# Patient Record
Sex: Female | Born: 1975 | Race: White | Hispanic: No | Marital: Married | Smoking: Never smoker
Health system: Southern US, Community
[De-identification: ages and names within clinical notes are randomized; demographics above are authoritative.]

## PROBLEM LIST (undated history)

## (undated) DIAGNOSIS — E039 Hypothyroidism, unspecified: Secondary | ICD-10-CM

## (undated) HISTORY — PX: TUBAL LIGATION: SHX77

## (undated) HISTORY — PX: CARDIAC SURGERY: SHX584

---

## 1997-04-21 ENCOUNTER — Ambulatory Visit (HOSPITAL_COMMUNITY): Admission: RE | Admit: 1997-04-21 | Discharge: 1997-04-21 | Payer: Self-pay | Admitting: Obstetrics and Gynecology

## 1997-07-15 ENCOUNTER — Inpatient Hospital Stay (HOSPITAL_COMMUNITY): Admission: AD | Admit: 1997-07-15 | Discharge: 1997-07-15 | Payer: Self-pay | Admitting: Obstetrics and Gynecology

## 1997-07-23 ENCOUNTER — Inpatient Hospital Stay (HOSPITAL_COMMUNITY): Admission: AD | Admit: 1997-07-23 | Discharge: 1997-07-26 | Payer: Self-pay | Admitting: Obstetrics and Gynecology

## 1997-09-04 ENCOUNTER — Other Ambulatory Visit: Admission: RE | Admit: 1997-09-04 | Discharge: 1997-09-04 | Payer: Self-pay | Admitting: Obstetrics and Gynecology

## 1998-09-07 ENCOUNTER — Other Ambulatory Visit: Admission: RE | Admit: 1998-09-07 | Discharge: 1998-09-07 | Payer: Self-pay | Admitting: Obstetrics and Gynecology

## 2000-04-07 ENCOUNTER — Other Ambulatory Visit: Admission: RE | Admit: 2000-04-07 | Discharge: 2000-04-07 | Payer: Self-pay | Admitting: Obstetrics and Gynecology

## 2001-07-23 ENCOUNTER — Ambulatory Visit (HOSPITAL_COMMUNITY): Admission: RE | Admit: 2001-07-23 | Discharge: 2001-07-23 | Payer: Self-pay | Admitting: Obstetrics and Gynecology

## 2001-10-19 ENCOUNTER — Inpatient Hospital Stay (HOSPITAL_COMMUNITY): Admission: AD | Admit: 2001-10-19 | Discharge: 2001-10-22 | Payer: Self-pay | Admitting: Obstetrics and Gynecology

## 2001-12-05 ENCOUNTER — Other Ambulatory Visit: Admission: RE | Admit: 2001-12-05 | Discharge: 2001-12-05 | Payer: Self-pay | Admitting: Obstetrics and Gynecology

## 2002-12-31 ENCOUNTER — Other Ambulatory Visit: Admission: RE | Admit: 2002-12-31 | Discharge: 2002-12-31 | Payer: Self-pay | Admitting: Obstetrics and Gynecology

## 2004-01-29 ENCOUNTER — Other Ambulatory Visit: Admission: RE | Admit: 2004-01-29 | Discharge: 2004-01-29 | Payer: Self-pay | Admitting: Obstetrics and Gynecology

## 2005-02-24 ENCOUNTER — Other Ambulatory Visit: Admission: RE | Admit: 2005-02-24 | Discharge: 2005-02-24 | Payer: Self-pay | Admitting: Obstetrics and Gynecology

## 2007-09-21 ENCOUNTER — Inpatient Hospital Stay (HOSPITAL_COMMUNITY): Admission: RE | Admit: 2007-09-21 | Discharge: 2007-09-23 | Payer: Self-pay | Admitting: Obstetrics and Gynecology

## 2010-06-08 NOTE — H&P (Signed)
NAMEBEAUTY, PLESS                    ACCOUNT NO.:  [id number]   MEDICAL RECORD NO.:  [id number]          PATIENT TYPE:  INP   LOCATION:  NA                            FACILITY:  [facility]   PHYSICIAN:  Duke Salvia. Marcelle Overlie, M.D.DATE OF BIRTH:  1975/12/08   DATE OF ADMISSION:  09/20/2007  DATE OF DISCHARGE:                              HISTORY & PHYSICAL   CHIEF COMPLAINT:  Repeat cesarean section, term.   HISTORY OF PRESENT ILLNESS:  A 35 year old G3 P2, EDD 09/29/2007,  presents for RCS after an uncomplicated prenatal course.  First  trimester screen and AFP screening were normal, GBS was negative.  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  Two previous cesareans,  scheduled for repeat.  This procedure including risks related to  bleeding, infection, adjacent organ injury, transfusion, her expected  recovery time, all related to her which she understands and accepts.   PAST MEDICAL HISTORY:   ALLERGIES:  None.   OPERATIONS:  Cesarean section x2.  Please see the [facility] form for the  details of her past medical history.   PHYSICAL EXAMINATION:  VITAL SIGNS:  Temperature 98.2, blood pressure  120/78.  HEENT: Unremarkable.  NECK:  Supple without masses.  LUNGS:  Clear.  CARDIOVASCULAR:  Regular rate and rhythm without murmurs, rubs, gallops  noted.  BREASTS:  Not examined.  Term fundal height.  Fetal heart rate was 140.  Cervix was closed.  EXTREMITIES/NEUROLOGIC:  Exam unremarkable.   IMPRESSION:  Term IUP.   PLAN:  Repeat cesarean section.  Procedure and risks reviewed as above.      Richard M. Marcelle Overlie, M.D.  Electronically Signed     RMH/MEDQ  D:  09/20/2007  T:  09/20/2007  Job:  [id number]

## 2010-06-08 NOTE — Op Note (Signed)
Christy Blake, Christy Blake                    ACCOUNT NO.:  [id number]   MEDICAL RECORD NO.:  [id number]          PATIENT TYPE:  INP   LOCATION:  9112                          FACILITY:  [facility]   PHYSICIAN:  Duke Salvia. Marcelle Overlie, M.D.DATE OF BIRTH:  [birth year]   DATE OF PROCEDURE:  DATE OF DISCHARGE:                               OPERATIVE REPORT   PREOPERATIVE DIAGNOSES:  1. Term intrauterine pregnancy for repeat cesarean section.  2. Request permanent sterilization.   POSTOPERATIVE DIAGNOSES:  1. Term intrauterine pregnancy for repeat cesarean section.  2. Request permanent sterilization.   PROCEDURE:  Repeat low-transverse cesarean section and Filshie clip  tubal ligation.   SURGEON:  Duke Salvia. Marcelle Overlie, MD.   ANESTHESIA:  Spinal.   COMPLICATIONS:  None.   DRAINS:  Foley catheter.   BLOOD LOSS:  700 mL.   PROCEDURE AND FINDINGS:  The patient was taken to the operating room  after adequate level of spinal anesthetic was obtained with the patient  in the left tilt position.  The abdomen was prepped and draped in the  usual manner for sterile abdominal procedure.  Foley catheter positioned  draining clear urine.  A transverse incision was made through the old  scar, which was well healed and carried down the fascia, which was  incised, and extended transversely.  Rectus muscles were divided in the  midline.  Peritoneum entered superiorly without incident and extended  vertically.  The vesicouterine serosa was then incised and the bladder  was bluntly and sharply dissected below, bladder blade was positioned at  that point.  A transverse incision was made at the lower segment,  extended with blunt dissection, clear fluid noted.   The patient then delivered of a healthy female Apgars 9 and 9, 8 pounds  and 5 ounces.  The infant was suctioned, cord clamped and passed to the  pediatric team for further care.  The placenta was delivered manually  and tagged, uterus exteriorized, cavity wiped  clean with laparotomy  pack.  Closure obtained with first layer of 0 chromic in a locked  fashion followed by an imbricating layer of 0 chromic.  This was  hemostatic.  The bladder flap area was inspected carefully and noted to  be intact and hemostatic.  Tubes and ovaries were normal.  Filshie clip  was then applied at the right angle, 3-cm from the cornua on each side  at the right, completely engulfing the tube with excellent application.  This was repeated on the opposite side.  The uterus was then returned to  abdominal position.  Prior to closure, sponge, needle, and instrument  counts were reported correct x2.  The perineum was closed with running 2-  0 Vicryl suture.  Rectus muscles were reapproximated in the midline with  2-0 Vicryl interrupted sutures.  Fascia was closed  from laterally to midline on either side with a 0 PDS suture.  Subcutaneous tissue was undermined to effect better closure, made  hemostatic with the Bovie.  Clips and Steri-Strips were used on the skin  along with a pressure dressing.  She  tolerated this well, went to  recovery room in good condition.  Clear urine noted during the case.      Richard M. Marcelle Overlie, M.D.  Electronically Signed     RMH/MEDQ  D:  09/21/2007  T:  09/21/2007  Job:  [id number]

## 2010-06-11 NOTE — Discharge Summary (Signed)
Christy Blake, COTTERMAN                    ACCOUNT NO.:  [id number]   MEDICAL RECORD NO.:  [id number]          PATIENT TYPE:  INP   LOCATION:  9112                          FACILITY:  [facility]   PHYSICIAN:  Freddy Finner, M.D.   DATE OF BIRTH:  04/15/1975   DATE OF ADMISSION:  09/21/2007  DATE OF DISCHARGE:  09/23/2007                               DISCHARGE SUMMARY   ADMITTING DIAGNOSES:  1. Intrauterine pregnancy at term.  2. Previous cesarean section, desires repeat.  3. Multiparity desires permanent sterilization.   DISCHARGE DIAGNOSES:  1. Status post low transverse cesarean section.  2. Viable female infant.  3. Permanent sterilization.   PROCEDURE:  Repeat low transverse cesarean section.   REASON FOR ADMISSION:  Please see dictated H&P.   HOSPITAL COURSE:  The patient was a 35 year old gravida 3, para 2 that  presents to [facility] for scheduled cesarean delivery.  The patient had a previous cesarean delivery and desired repeat.  Due to  multiparity, the patient had also requested permanent sterilization.  On  the morning of admission, the patient was taken to the operating room  where spinal anesthesia was administered without difficulty.  A low  transverse incision was made with delivery of a viable female infant  weighing 8 pounds 5 ounces.  Apgars of 9 at 1 minutes, 9 at 5 minutes.  Bilateral tubal ligation was performed without difficulty.  The patient  tolerated procedure well and was taken to the recovery room in stable  condition.  On postoperative day #1, the patient was without complaint.  Vital signs were stable.  She was afebrile.  Fundus firm and nontender.  Abdominal dressing noted to be clean, dry, and intact.   Laboratory findings showed hemoglobin of 11.6.  On postoperative day #2,  the patient did desire early discharge.  Vital signs remained stable.  She was afebrile.  Fundus firm and nontender.  Incision was clean, dry,  and intact.  Discharge  instructions were reviewed and the patient was  later discharged to home.   CONDITION ON DISCHARGE:  Stable.   DIET:  Regular as tolerated.   ACTIVITY:  No heavy lifting.  No driving x2 weeks.  No vaginal entry.   FOLLOW UP:  The patient to follow up in the office in 2-3 days for  staple removal.  She is to call for temperature greater than 100  degrees, persistent nausea, vomiting, and heavy vaginal bleeding and/or  redness or drainage from the incisional site.   DISCHARGE MEDICATIONS:  1. Tylox #30 one p.o. every 4-6 hours p.r.n.  2. Motrin 600 mg every 6 hours p.r.n.  3. Prenatal vitamins 1 p.o. daily.  4. Colace 1 p.o. daily p.r.n.      Julio Sicks, N.P.      Freddy Finner, M.D.  Electronically Signed    CC/MEDQ  D:  10/17/2007  T:  10/17/2007  Job:  [id number]

## 2010-06-11 NOTE — Discharge Summary (Signed)
NAME:  Christy Blake, Christy Blake                              ACCOUNT NO.:  [id number]   MEDICAL RECORD NO.:  [id number]                   PATIENT TYPE:  INP   LOCATION:  9121                                 FACILITY:  [facility]   PHYSICIAN:  Juluis Mire, M.D.                DATE OF BIRTH:  May 02, 1975   DATE OF ADMISSION:  10/19/2001  DATE OF DISCHARGE:  10/22/2001                                 DISCHARGE SUMMARY   ADMITTING DIAGNOSES:  1. Intrauterine pregnancy at term.  2. Previous cesarean delivery, desires of repeat.   DISCHARGE DIAGNOSES:  1. Status post cesarean delivery.  2. Viable female infant.   PROCEDURE:  Repeat low transverse cesarean section.   REASON FOR ADMISSION:  Please see dictated H&P.   HOSPITAL COURSE:  The patient presented to the hospital for a scheduled  cesarean section.  The patient had had a previous cesarean section for  failure to progress.  She declined a vaginal birth after cesarean and  presented for repeat cesarean section at term.  The patient was taken to the  operating room where spinal anesthesia was administered without difficulty.  A low transverse incision was made with delivery of a viable female infant  weighing 8 pounds 5 ounces with Apgars of 8 at one minute, 9 at five  minutes.  The patient tolerated procedure well and was taken to the recovery  room in stable condition.  On postoperative day one abdomen was soft.  The  patient had good return of bowel function.  Abdominal dressing was clean,  dry, and intact.  Laboratories revealed a hemoglobin of 12.3, platelet count  of 147,000, WBC count of 11.8.  On postoperative day two patient was  tolerating a regular diet without complaint of nausea and vomiting.  Abdomen  was soft.  Abdominal dressing was removed and incision was clean, dry, and  intact.  The fundus was firm and nontender.  On postoperative day three  patient was without complaint.  Abdomen was soft.  The patient was  ambulating well.   Incision was clean, dry, and intact.  Staples were  removed.  RhoGAM was administered and patient was discharged home.   CONDITION ON DISCHARGE:  Good.   DIET:  Regular, as tolerated.   ACTIVITY:  No heavy lifting.  No driving x2 weeks.  No vaginal entry.   FOLLOW UP:  The patient is to follow up in the office in one to two weeks  for an incision check.  She is to call for temperature greater than 100  degrees, persistent nausea or vomiting, heavy vaginal bleeding, and/or  redness or drainage from the incision site.   DISCHARGE MEDICATIONS:  1. Percocet 5/325 number 30 one p.o. q.4-6h. p.r.n. pain.  2.     Ibuprofen 600 mg q.6h. p.r.n.  3. Prenatal vitamins one p.o. daily.  4. Colace one p.o. daily p.r.n.  Julio Sicks, NP                          Juluis Mire, M.D.    CC/MEDQ  D:  11/18/2001  T:  11/19/2001  Job:  [id number]

## 2010-06-11 NOTE — H&P (Signed)
   NAME:  Christy Blake, Christy Blake                              ACCOUNT NO.:  [id number]   MEDICAL RECORD NO.:  [id number]                   PATIENT TYPE:  INP   LOCATION:  NA                                   [facility]   PHYSICIAN:  Duke Salvia. Marcelle Overlie, M.D.            DATE OF BIRTH:   DATE OF ADMISSION:  DATE OF DISCHARGE:                                HISTORY & PHYSICAL   CHIEF COMPLAINT:  Repeat cesarean section at term.   HISTORY OF PRESENT ILLNESS:  The patient is a 35 year old G2, P-1-0-0-1 with  previous cesarean section for failure to progress.  She declines VBAC and  presents for repeat cesarean section at term.  The risks of the procedure  including bleeding, infection, transfusion and adjacent organ injury were  all reviewed with her which she understands and accepts.  This pregnancy has  been uncomplicated.  She has a blood type of A-.  She did receive RhoGAM.  Rubella titer was positive.  One-hour GGT was 87.  Group B Strep was screen  was negative.   PAST MEDICAL HISTORY:  Negative.   ALLERGIES:  None.   OPERATION:  Cesarean section in 1999.   REVIEW OF SYSTEMS:  Otherwise unremarkable except for corrective open heart  surgery in the patient at age 57 years old.  Her cardiac status as an adult  has been functionally normal.   PHYSICAL EXAMINATION:  GENERAL:  Temperature 92, blood pressure 120/68.  HEENT:  Unremarkable  NECK:  Supple without masses.  LUNGS:  Clear.  CARDIOVASCULAR:  Regular rate and rhythm without murmurs, rubs or gallops.  BREASTS:  Not examined.  ABDOMEN:  Term fundal height.  Fetal heart rate 140.  PELVIC:  Cervix closed.  NEUROLOGICAL:  Unremarkable.   IMPRESSION:  Term intrauterine pregnancy, previous cesarean section.  Declines vaginal birth after cesarean section.   PLAN:  Repeat cesarean section.  Procedure and risks reviewed as above.                                                Richard M. Marcelle Overlie, M.D.    RMH/MEDQ  D:  10/18/2001   T:  10/18/2001  Job:  [id number]

## 2010-06-11 NOTE — Op Note (Signed)
NAME:  Christy Blake, Christy Blake                              ACCOUNT NO.:  [id number]   MEDICAL RECORD NO.:  [id number]                   PATIENT TYPE:  INP   LOCATION:  9198                                 [facility]ILITY[facility]   PHYSICIAN:  Duke Salvia. Marcelle Overlie, M.D.            DATE OF BIRTH:  Mar 01, 1975   DATE OF PROCEDURE:  10/19/2001  DATE OF DISCHARGE:                                 OPERATIVE REPORT   PREOPERATIVE DIAGNOSES:  Repeat cesarean section at term.   POSTOPERATIVE DIAGNOSES:  Repeat cesarean section at term.   PROCEDURE:  Repeat low transverse cesarean section.   SURGEON:  Duke Salvia. Marcelle Overlie, M.D.   ASSISTANT:  Guy Sandifer. Henderson Cloud, M.D.   ANESTHESIA:  Spinal.   COMPLICATIONS:  None.   DRAINS:  Foley catheter.   ESTIMATED BLOOD LOSS:  800.   PROCEDURE AND FINDINGS:  The patient went to the operating room.  After an  adequate level of spinal anesthetic was obtained with the patient in supine  position, the abdomen prepped and draped in usual manner for sterile  abdominal procedures, Foley tip catheter positioned draining clear urine.  Transverse incision made through the old scar which was well healed, carried  down to the fascia which was incised and extended transversely.  Rectus  muscle divided in the midline.  Peritoneum entered superiorly without  incident and extended in a vertical manner.  The vesicouterine serosa was  then incised and the bladder was bluntly and sharply dissected off of the  lower uterine segment and a bladder blade was positioned.  Transverse  incision made in the lower uterine segment.  Clear fluid was noted.  The  patient then delivered of a healthy female.  Apgars 9 and 9.  Loose nuchal  cord x1.  The infant was suctioned, cord clamped and passed pediatric team  for further care.  Placenta delivered manually intact.  The uterus  exteriorized.  Cavity wiped clean with a laparotomy pack.  Closure obtained  of the first layer of 0 chromic in a locked fashion  followed by an  embrocating layer of 0 chromic.  This was hemostatic.  Tubes and ovaries  were normal.  Prior to closure sponge, needle, and instrument counts  reported as correct x2.  Rectus muscles reapproximated with 2-0 Dexon  interrupted sutures.  Fascia closed from laterally to midline on either side  with a 0 PDS suture.  Subcutaneous fat was hemostatic.  Clips and Steri-  Strips were used on the skin.  She tolerated this well.  Went to recovery  room in good condition.  Clear urine noted at the end of the case.  She  received Pitocin IV and Ancef 1 g IV after the cord was clamped.  Richard M. Marcelle Overlie, M.D.    RMH/MEDQ  D:  10/19/2001  T:  10/19/2001  Job:  [id number] [id number]

## 2013-09-09 ENCOUNTER — Emergency Department (HOSPITAL_BASED_OUTPATIENT_CLINIC_OR_DEPARTMENT_OTHER)
Admission: EM | Admit: 2013-09-09 | Discharge: 2013-09-09 | Disposition: A | Discharge status: Home / Self Care | Payer: BC Managed Care – PPO | Attending: Emergency Medicine | Admitting: Emergency Medicine

## 2013-09-09 ENCOUNTER — Encounter (HOSPITAL_BASED_OUTPATIENT_CLINIC_OR_DEPARTMENT_OTHER): Payer: Self-pay | Admitting: Emergency Medicine

## 2013-09-09 DIAGNOSIS — M545 Low back pain, unspecified: Secondary | ICD-10-CM | POA: Diagnosis present

## 2013-09-09 DIAGNOSIS — Z9889 Other specified postprocedural states: Secondary | ICD-10-CM | POA: Insufficient documentation

## 2013-09-09 DIAGNOSIS — Z791 Long term (current) use of non-steroidal anti-inflammatories (NSAID): Secondary | ICD-10-CM | POA: Insufficient documentation

## 2013-09-09 DIAGNOSIS — R000 Tachycardia, unspecified: Secondary | ICD-10-CM | POA: Insufficient documentation

## 2013-09-09 DIAGNOSIS — Z79899 Other long term (current) drug therapy: Secondary | ICD-10-CM | POA: Diagnosis not present

## 2013-09-09 MED ORDER — ONDANSETRON HCL 4 MG/2ML IJ SOLN
4.0000 mg | Freq: Once | INTRAMUSCULAR | Status: AC
  Administered 2013-09-09: 4 mg via INTRAMUSCULAR
  Filled 2013-09-09: qty 2

## 2013-09-09 MED ORDER — HYDROMORPHONE HCL PF 1 MG/ML IJ SOLN
2.0000 mg | Freq: Once | INTRAMUSCULAR | Status: AC
  Administered 2013-09-09: 2 mg via INTRAMUSCULAR
  Filled 2013-09-09: qty 2

## 2013-09-09 MED ORDER — KETOROLAC TROMETHAMINE 60 MG/2ML IM SOLN
60.0000 mg | Freq: Once | INTRAMUSCULAR | Status: AC
  Administered 2013-09-09: 60 mg via INTRAMUSCULAR
  Filled 2013-09-09: qty 2

## 2013-09-09 MED ORDER — HYDROCODONE-ACETAMINOPHEN 5-325 MG PO TABS: 1.0000 | ORAL_TABLET | ORAL | Status: DC | PRN

## 2013-09-09 NOTE — ED Notes (Signed)
Shari Upstill, PA-C at bedside 

## 2013-09-09 NOTE — ED Notes (Signed)
Pt c/o right lower back pain which radiates down right leg x 3 days

## 2013-09-09 NOTE — ED Provider Notes (Signed)
CSN: [id number]     Arrival date & time 09/09/13  1525 History   First MD Initiated Contact with Patient 09/09/13 1533     Chief Complaint  Patient presents with  . Back Pain     (Consider location/radiation/quality/duration/timing/severity/associated sxs/prior Treatment) Patient is a 38 y.o. female presenting with back pain. The history is provided by the patient. No language interpreter was used.  Back Pain Location:  Lumbar spine Quality:  Aching Pain severity:  Severe Pain is:  Worse during the day Onset quality:  Gradual Duration:  2 months Timing:  Constant Progression:  Worsening Chronicity:  New Context: not recent illness   Relieved by:  Nothing Worsened by:  Nothing tried Ineffective treatments:  None tried Associated symptoms: leg pain   Pt has had pain in her back for the past 2 months.  Pt reports pain goes down her leg.  History reviewed. No pertinent past medical history. Past Surgical History  Procedure Laterality Date  . Cardiac surgery    . Cesarean section    . Tubal ligation     History reviewed. No pertinent family history. History  Substance Use Topics  . Smoking status: Never Smoker   . Smokeless tobacco: Not on file  . Alcohol Use: 1.5 oz/week    3 drink(s) per week   OB History   Grav Para Term Preterm Abortions TAB SAB Ect Mult Living                 Review of Systems  Musculoskeletal: Positive for back pain.  All other systems reviewed and are negative.     Allergies  Review of patient's allergies indicates no known allergies.  Home Medications   Prior to Admission medications   Medication Sig Start Date End Date Taking? Authorizing Provider  iron polysaccharides (NIFEREX) 150 MG capsule Take 90 mg by mouth daily.   Yes Historical Provider, MD  levothyroxine (SYNTHROID, LEVOTHROID) 25 MCG tablet Take 25 mcg by mouth daily before breakfast.   Yes Historical Provider, MD  meloxicam (MOBIC) 15 MG tablet Take 15 mg by mouth daily.    Yes Historical Provider, MD   BP 149/100  Pulse 136  Temp(Src) 98 F (36.7 C) (Oral)  Resp 16  Ht 5\' 6"  (1.676 m)  Wt 140 lb (63.504 kg)  BMI 22.61 kg/m2  SpO2 100% Physical Exam  Nursing note and vitals reviewed. Constitutional: She is oriented to person, place, and time. She appears well-developed and well-nourished.  HENT:  Head: Normocephalic.  Eyes: Pupils are equal, round, and reactive to light.  Neck: Neck supple.  Cardiovascular:  tachycardic  Pulmonary/Chest: Effort normal.  Abdominal: Soft.  Musculoskeletal: She exhibits tenderness.  Tender right sciatic area  Neurological: She is alert and oriented to person, place, and time.  Skin: Skin is warm.  Psychiatric: She has a normal mood and affect.    ED Course  Procedures (including critical care time) Labs Review Labs Reviewed - No data to display  Imaging Review No results found.   EKG Interpretation None      MDM   Final diagnoses:  None        Elson AreasLeslie K Isrrael Fluckiger, PA-C 09/09/13 1610

## 2013-09-09 NOTE — Discharge Instructions (Signed)
Back Pain, Adult °Low back pain is very common. About 1 in 5 people have back pain. The cause of low back pain is rarely dangerous. The pain often gets better over time. About half of people with a sudden onset of back pain feel better in just 2 weeks. About 8 in 10 people feel better by 6 weeks.  °CAUSES °Some common causes of back pain include: °· Strain of the muscles or ligaments supporting the spine. °· Wear and tear (degeneration) of the spinal discs. °· Arthritis. °· Direct injury to the back. °DIAGNOSIS °Most of the time, the direct cause of low back pain is not known. However, back pain can be treated effectively even when the exact cause of the pain is unknown. Answering your caregiver's questions about your overall health and symptoms is one of the most accurate ways to make sure the cause of your pain is not dangerous. If your caregiver needs more information, he or she may order lab work or imaging tests (X-rays or MRIs). However, even if imaging tests show changes in your back, this usually does not require surgery. °HOME CARE INSTRUCTIONS °For many people, back pain returns. Since low back pain is rarely dangerous, it is often a condition that people can learn to manage on their own.  °· Remain active. It is stressful on the back to sit or stand in one place. Do not sit, drive, or stand in one place for more than 30 minutes at a time. Take short walks on level surfaces as soon as pain allows. Try to increase the length of time you walk each day. °· Do not stay in bed. Resting more than 1 or 2 days can delay your recovery. °· Do not avoid exercise or work. Your body is made to move. It is not dangerous to be active, even though your back may hurt. Your back will likely heal faster if you return to being active before your pain is gone. °· Pay attention to your body when you  bend and lift. Many people have less discomfort when lifting if they bend their knees, keep the load close to their bodies, and  avoid twisting. Often, the most comfortable positions are those that put less stress on your recovering back. °· Find a comfortable position to sleep. Use a firm mattress and lie on your side with your knees slightly bent. If you lie on your back, put a pillow under your knees. °· Only take over-the-counter or prescription medicines as directed by your caregiver. Over-the-counter medicines to reduce pain and inflammation are often the most helpful. Your caregiver may prescribe muscle relaxant drugs. These medicines help dull your pain so you can more quickly return to your normal activities and healthy exercise. °· Put ice on the injured area. °¨ Put ice in a plastic bag. °¨ Place a towel between your skin and the bag. °¨ Leave the ice on for 15-20 minutes, 03-04 times a day for the first 2 to 3 days. After that, ice and heat may be alternated to reduce pain and spasms. °· Ask your caregiver about trying back exercises and gentle massage. This may be of some benefit. °· Avoid feeling anxious or stressed. Stress increases muscle tension and can worsen back pain. It is important to recognize when you are anxious or stressed and learn ways to manage it. Exercise is a great option. °SEEK MEDICAL CARE IF: °· You have pain that is not relieved with rest or medicine. °· You have pain that does not improve in 1 week. °· You have new symptoms. °· You are generally not feeling well. °SEEK   IMMEDIATE MEDICAL CARE IF:  °· You have pain that radiates from your back into your legs. °· You develop new bowel or bladder control problems. °· You have unusual weakness or numbness in your arms or legs. °· You develop nausea or vomiting. °· You develop abdominal pain. °· You feel faint. °Document Released: 01/10/2005 Document Revised: 07/12/2011 Document Reviewed: 05/14/2013 °[employer]® Patient Information ©2015 [employer]. This information is not intended to replace advice given to you by your health care provider. Make sure you  discuss any questions you have with your health care provider. °Cryotherapy °Cryotherapy means treatment with cold. Ice or gel packs can be used to reduce both pain and swelling. Ice is the most helpful within the first 24 to 48 hours after an injury or flare-up from overusing a muscle or joint. Sprains, strains, spasms, burning pain, shooting pain, and aches can all be eased with ice. Ice can also be used when recovering from surgery. Ice is effective, has very few side effects, and is safe for most people to use. °PRECAUTIONS  °Ice is not a safe treatment option for people with: °· Raynaud phenomenon. This is a condition affecting small blood vessels in the extremities. Exposure to cold may cause your problems to return. °· Cold hypersensitivity. There are many forms of cold hypersensitivity, including: °¨ Cold urticaria. Red, itchy hives appear on the skin when the tissues begin to warm after being iced. °¨ Cold erythema. This is a red, itchy rash caused by exposure to cold. °¨ Cold hemoglobinuria. Red blood cells break down when the tissues begin to warm after being iced. The hemoglobin that carry oxygen are passed into the urine because they cannot combine with blood proteins fast enough. °· Numbness or altered sensitivity in the area being iced. °If you have any of the following conditions, do not use ice until you have discussed cryotherapy with your caregiver: °· Heart conditions, such as arrhythmia, angina, or chronic heart disease. °· High blood pressure. °· Healing wounds or open skin in the area being iced. °· Current infections. °· Rheumatoid arthritis. °· Poor circulation. °· Diabetes. °Ice slows the blood flow in the region it is applied. This is beneficial when trying to stop inflamed tissues from spreading irritating chemicals to surrounding tissues. However, if you expose your skin to cold temperatures for too long or without the proper protection, you can damage your skin or nerves. Watch for  signs of skin damage due to cold. °HOME CARE INSTRUCTIONS °Follow these tips to use ice and cold packs safely. °· Place a dry or damp towel between the ice and skin. A damp towel will cool the skin more quickly, so you may need to shorten the time that the ice is used. °· For a more rapid response, add gentle compression to the ice. °· Ice for no more than 10 to 20 minutes at a time. The bonier the area you are icing, the less time it will take to get the benefits of ice. °· Check your skin after 5 minutes to make sure there are no signs of a poor response to cold or skin damage. °· Rest 20 minutes or more between uses. °· Once your skin is numb, you can end your treatment. You can test numbness by very lightly touching your skin. The touch should be so light that you do not see the skin dimple from the pressure of your fingertip. When using ice, most people will feel these normal sensations in this order: cold,   burning, aching, and numbness. °· Do not use ice on someone who cannot communicate their responses to pain, such as small children or people with dementia. °HOW TO MAKE AN ICE PACK °Ice packs are the most common way to use ice therapy. Other methods include ice massage, ice baths, and cryosprays. Muscle creams that cause a cold, tingly feeling do not offer the same benefits that ice offers and should not be used as a substitute unless recommended by your caregiver. °To make an ice pack, do one of the following: °· Place crushed ice or a bag of frozen vegetables in a sealable plastic bag. Squeeze out the excess air. Place this bag inside another plastic bag. Slide the bag into a pillowcase or place a damp towel between your skin and the bag. °· Mix 3 parts water with 1 part rubbing alcohol. Freeze the mixture in a sealable plastic bag. When you remove the mixture from the freezer, it will be slushy. Squeeze out the excess air. Place this bag inside another plastic bag. Slide the bag into a pillowcase or place  a damp towel between your skin and the bag. °SEEK MEDICAL CARE IF: °· You develop white spots on your skin. This may give the skin a blotchy (mottled) appearance. °· Your skin turns blue or pale. °· Your skin becomes waxy or hard. °· Your swelling gets worse. °MAKE SURE YOU:  °· Understand these instructions. °· Will watch your condition. °· Will get help right away if you are not doing well or get worse. °Document Released: 09/06/2010 Document Revised: 05/27/2013 Document Reviewed: 09/06/2010 °Exit[employer]® Patient Information ©2015 [employer]. This information is not intended to replace advice given to you by your health care provider. Make sure you discuss any questions you have with your health care provider. ° °

## 2013-09-09 NOTE — ED Notes (Signed)
Soda provided

## 2013-09-09 NOTE — ED Provider Notes (Signed)
Recheck after medications finds the patient in less pain. Ambulatory without difficulty. Reports pain controlled. No further concerns. All questions answered for patient and husband.  Arnoldo HookerShari A Donatello Kleve, PA-C 09/09/13 1720

## 2013-09-09 NOTE — ED Provider Notes (Signed)
Medical screening examination/treatment/procedure(s) were performed by non-physician practitioner and as supervising physician I was immediately available for consultation/collaboration.   EKG Interpretation None       Jonte Wollam, MD 09/09/13 2327 

## 2013-09-09 NOTE — ED Provider Notes (Signed)
Medical screening examination/treatment/procedure(s) were performed by non-physician practitioner and as supervising physician I was immediately available for consultation/collaboration.   EKG Interpretation None       Doug SouSam Burris Matherne, MD 09/09/13 [phone]

## 2013-09-10 ENCOUNTER — Other Ambulatory Visit: Payer: Self-pay | Admitting: Physical Medicine and Rehabilitation

## 2013-09-10 DIAGNOSIS — M545 Low back pain, unspecified: Secondary | ICD-10-CM

## 2013-09-13 ENCOUNTER — Ambulatory Visit
Admission: RE | Admit: 2013-09-13 | Discharge: 2013-09-13 | Disposition: A | Discharge status: Home / Self Care | Payer: BC Managed Care – PPO | Source: Ambulatory Visit | Attending: Physical Medicine and Rehabilitation | Admitting: Physical Medicine and Rehabilitation

## 2013-09-13 DIAGNOSIS — M545 Low back pain, unspecified: Secondary | ICD-10-CM

## 2013-10-30 ENCOUNTER — Other Ambulatory Visit (HOSPITAL_COMMUNITY): Payer: Self-pay | Admitting: Specialist

## 2013-11-01 ENCOUNTER — Encounter (HOSPITAL_COMMUNITY): Payer: Self-pay | Admitting: Pharmacy Technician

## 2013-11-06 ENCOUNTER — Ambulatory Visit (HOSPITAL_COMMUNITY)
Admission: RE | Admit: 2013-11-06 | Discharge: 2013-11-06 | Disposition: A | Discharge status: Home / Self Care | Payer: BC Managed Care – PPO | Source: Ambulatory Visit | Attending: Specialist | Admitting: Specialist

## 2013-11-06 ENCOUNTER — Encounter (HOSPITAL_COMMUNITY): Payer: Self-pay

## 2013-11-06 DIAGNOSIS — M5126 Other intervertebral disc displacement, lumbar region: Secondary | ICD-10-CM | POA: Diagnosis not present

## 2013-11-06 HISTORY — DX: Hypothyroidism, unspecified: E03.9

## 2013-11-06 LAB — CBC
HEMATOCRIT: 39.2 % (ref 36.0–46.0)
HEMOGLOBIN: 13.6 g/dL (ref 12.0–15.0)
MCH: 31.9 pg (ref 26.0–34.0)
MCHC: 34.7 g/dL (ref 30.0–36.0)
MCV: 92 fL (ref 78.0–100.0)
Platelets: 166 10*3/uL (ref 150–400)
RBC: 4.26 MIL/uL (ref 3.87–5.11)
RDW: 12.4 % (ref 11.5–15.5)
WBC: 5.4 10*3/uL (ref 4.0–10.5)

## 2013-11-06 LAB — COMPREHENSIVE METABOLIC PANEL
ALBUMIN: 4.4 g/dL (ref 3.5–5.2)
ALT: 19 U/L (ref 0–35)
ANION GAP: 14 (ref 5–15)
AST: 19 U/L (ref 0–37)
Alkaline Phosphatase: 51 U/L (ref 39–117)
BILIRUBIN TOTAL: 0.4 mg/dL (ref 0.3–1.2)
BUN: 8 mg/dL (ref 6–23)
CHLORIDE: 101 meq/L (ref 96–112)
CO2: 24 mEq/L (ref 19–32)
CREATININE: 0.7 mg/dL (ref 0.50–1.10)
Calcium: 9.5 mg/dL (ref 8.4–10.5)
GLUCOSE: 146 mg/dL — AB (ref 70–99)
Potassium: 3.8 mEq/L (ref 3.7–5.3)
Sodium: 139 mEq/L (ref 137–147)
Total Protein: 7.8 g/dL (ref 6.0–8.3)

## 2013-11-06 LAB — SURGICAL PCR SCREEN
MRSA, PCR: NEGATIVE
Staphylococcus aureus: NEGATIVE

## 2013-11-06 LAB — HCG, SERUM, QUALITATIVE: Preg, Serum: NEGATIVE

## 2013-11-06 NOTE — Pre-Procedure Instructions (Addendum)
Christy Blake  11/06/2013   Your procedure is scheduled on:  11/08/13  [facility] at 1030 AM.  Call this number if you have problems the morning of surgery: [phone]   Remember:   Do not eat food or drink liquids after midnight.   Take these medicines the morning of surgery with A SIP OF WATER: levothyroxine    STOP all herbel meds, nsaids (aleve,naproxen,advil,ibuprofen) now including vitamins,aspirin   Do not wear jewelry, make-up or nail polish.  Do not wear lotions, powders, or perfumes. You may wear deodorant.  Do not shave 48 hours prior to surgery. Men may shave face and neck.  Do not bring valuables to the hospital.  [facility] not [facility]                  for any belongings or valuables.               Contacts, dentures or bridgework may not be worn into surgery.  Leave suitcase in the car. After surgery it may be brought to your room.  For patients admitted to the hospital, discharge time is determined by your                treatment team.               Patients discharged the day of surgery will not be allowed to drive  home.  Name and phone number of your driver:   Special Instructions:  Special Instructions: [place] - Preparing for Surgery  Before surgery, you can play an important role.  Because skin is not sterile, your skin needs to be as free of germs as possible.  You can reduce the number of germs on you skin by washing with CHG (chlorahexidine gluconate) soap before surgery.  CHG is an antiseptic cleaner which kills germs and bonds with the skin to continue killing germs even after washing.  Please DO NOT use if you have an allergy to CHG or antibacterial soaps.  If your skin becomes reddened/irritated stop using the CHG and inform your nurse when you arrive at Short Stay.  Do not shave (including legs and underarms) for at least 48 hours prior to the first CHG shower.  You may shave your face.  Please follow these  instructions carefully:   1.  Shower with CHG Soap the night before surgery and the morning of Surgery.  2.  If you choose to wash your hair, wash your hair first as usual with your normal shampoo.  3.  After you shampoo, rinse your hair and body thoroughly to remove the Shampoo.  4.  Use CHG as you would any other liquid soap.  You can apply chg directly  to the skin and wash gently with scrungie or a clean washcloth.  5.  Apply the CHG Soap to your body ONLY FROM THE NECK DOWN.  Do not use on open wounds or open sores.  Avoid contact with your eyes ears, mouth and genitals (private parts).  Wash genitals (private parts)       with your normal soap.  6.  Wash thoroughly, paying special attention to the area where your surgery will be performed.  7.  Thoroughly rinse your body with warm water from the neck down.  8.  DO NOT shower/wash with your normal soap after using and rinsing off the CHG Soap.  9.  Pat yourself dry with a clean towel.  10.  Wear clean pajamas.            11.  Place clean sheets on your bed the night of your first shower and do not sleep with pets.  Day of Surgery  Do not apply any lotions/deodorants the morning of surgery.  Please wear clean clothes to the hospital/surgery center.   Please read over the following fact sheets that you were given: Pain Booklet, Coughing and Deep Breathing, MRSA Information and Surgical Site Infection Prevention

## 2013-11-06 NOTE — H&P (Signed)
Christy Blake is an 38 y.o. female.   Chief Complaint: LBP and right leg pain HPI:  HISTORY OF PRESENT ILLNESS:  The patient is a 38 year old female.  She has had a history of a previous back injury some 10 years ago when she took a jump from a tall structure and landed badly and had pain into her back and discomfort for several weeks.  No studies were done.  She eventually recovered.  She began experiencing increasing discomfort about 6 months ago with discomfort into her lower back and then radiation into the right buttock and into the right lower extremity.  The pain radiates along the back of the right thigh and buttock and calf and into the right lateral calf.  She has numbness over the toes from the great toe through the little toe but mainly over the great toe.  She has noticed weakness in the right leg and difficulty with standing and ambulation.  She was seen by Dr. Lynnda Shields of [facility].  After some 9 sessions, the pain seemed to be worsening with radiation to the lateral right calf.  She was seen by Dr. Ernestina Patches.  Dr. Ernestina Patches examined her and found her to have right-sided L5 radiculopathy with a positive slump test on the right.  Her EHL strength is decreased on the right.  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  She saw Dr. Ernestina Patches in July of this year and underwent an epidural steroid injection on the right side, L5 transforaminal, on 09/17/2013 and on 10/03/2013.  She reported some improvement in her discomfort and was referred then to physical therapy and saw the therapist at [place].  After a course of physical therapy over 2 weeks, she has had persistent pain into her back with discomfort into her right leg.  She is still having difficulty with sleeping due to leg pain, numbness, and paresthesias.  She is having  trouble and is unable to tolerate narcotic medicines due to nausea and has only been able to take Advil.  Her job involves sitting using a computer throughout the day.  She works as a Estate manager/land agent.  Her primary care doctor and OB/GYN doctor is Dr. Matthew Saras.  PHYSICAL EXAMINATION:  Her height is 5 feet 6 inches with a weight of 140 pounds.  She appears younger than her stated age.  Her gait is with a right leg limp.  She is unable to heel walk more than a short distance before the right foot drops.  She is able to toe walk relatively well.  Forward bending is fingertips to about the knee with discomfort.  Extension is with pain.  Reflexes at the knee are 2+ and symmetric and at the ankle 2+ and symmetric.  There is positive bowstring sign with extension of the right knee.  She has pain in her back with radiation to the right leg laterally.  She has decreased sensation in the right lateral calf and right great toe.  Popliteal compression sign is positive on the right.  She has weakness in the right EHL of 4/5.  The left EHL is 5/5.  Right foot dorsiflexion strength is 5-/5.  Right foot planter flexion strength is normal.  Knee extension and flexion strength is normal.  Hip abduction and adduction and hip flexion testing are normal.  She describes her pain in the right upper buttock and right lateral thigh and posterior calf into the right toes.   RADIOGRAPHS:  The patient's plain radiographs were reviewed.  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  The apex is curved to the left side at the L2 level.  Degenerative joint changes are seen at L4-L5 and mild at L5-S1.  The sacroiliac joints are well maintained.  The lateral radiograph of the lumbar spine shows degenerative disk narrowing at the L4-L5 level of about 40%.  The anterior aspect of the disk space is narrowed more  than the posterior aspect.  There is some slight straightening of the normal lordotic curve through this single segment at L4-L5.  Disk space height at L2-L3 is also slightly narrowed.   The patient's MRI scan was reviewed with the study done on 09/13/2013.  The study shows a disk protrusion at the L4-L5 level central and right-sided with flattening of the thecal sac causing mild to moderate spinal stenosis changes central and rightward.  There is subarticular stenosis on the right side with impingement of the L5 root.  Past Medical History  Diagnosis Date  . Hypothyroidism     Past Surgical History  Procedure Laterality Date  . Cardiac surgery      38 yrs old -pda no adult problems  . Cesarean section      x3  . Tubal ligation      History reviewed. No pertinent family history. Social History:  reports that she has never smoked. She does not have any smokeless tobacco history on file. She reports that she drinks about 1.5 ounces of alcohol per week. She reports that she does not use illicit drugs.  Allergies: No Known Allergies  Medications Prior to Admission  Medication Sig Dispense Refill  . iron polysaccharides (NIFEREX) 150 MG capsule Take 150 mg by mouth daily.       Marland Kitchen levothyroxine (SYNTHROID, LEVOTHROID) 25 MCG tablet Take 25 mcg by mouth daily before breakfast.        Results for orders placed during the hospital encounter of 11/06/13 (from the past 48 hour(s))  SURGICAL PCR SCREEN     Status: None   Collection Time    11/06/13  3:42 PM      Result Value Ref Range   MRSA, PCR NEGATIVE  NEGATIVE   Staphylococcus aureus NEGATIVE  NEGATIVE   Comment:            The Xpert SA Assay (FDA     approved for NASAL specimens     in patients over 65 years of age),     is one component of     a comprehensive surveillance     program.  Test performance has     been validated by [employer] for patients greater     than or equal to 63 year old.     It is not intended      to diagnose infection nor to     guide or monitor treatment.  CBC     Status: None   Collection Time    11/06/13  3:43 PM      Result Value Ref Range   WBC 5.4  4.0 - 10.5 K/uL   RBC 4.26  3.87 - 5.11 MIL/uL   Hemoglobin 13.6  12.0 - 15.0 g/dL  HCT 39.2  36.0 - 46.0 %   MCV 92.0  78.0 - 100.0 fL   MCH 31.9  26.0 - 34.0 pg   MCHC 34.7  30.0 - 36.0 g/dL   RDW 12.4  11.5 - 15.5 %   Platelets 166  150 - 400 K/uL  COMPREHENSIVE METABOLIC PANEL     Status: Abnormal   Collection Time    11/06/13  3:43 PM      Result Value Ref Range   Sodium 139  137 - 147 mEq/L   Potassium 3.8  3.7 - 5.3 mEq/L   Chloride 101  96 - 112 mEq/L   CO2 24  19 - 32 mEq/L   Glucose, Bld 146 (*) 70 - 99 mg/dL   BUN 8  6 - 23 mg/dL   Creatinine, Ser 0.70  0.50 - 1.10 mg/dL   Calcium 9.5  8.4 - 10.5 mg/dL   Total Protein 7.8  6.0 - 8.3 g/dL   Albumin 4.4  3.5 - 5.2 g/dL   AST 19  0 - 37 U/L   ALT 19  0 - 35 U/L   Alkaline Phosphatase 51  39 - 117 U/L   Total Bilirubin 0.4  0.3 - 1.2 mg/dL   GFR calc non Af Amer >90  >90 mL/min   GFR calc Af Amer >90  >90 mL/min   Comment: (NOTE)     The eGFR has been calculated using the CKD EPI equation.     This calculation has not been validated in all clinical situations.     eGFR's persistently <90 mL/min signify possible Chronic Kidney     Disease.   Anion gap 14  5 - 15  HCG, SERUM, QUALITATIVE     Status: None   Collection Time    11/06/13  3:43 PM      Result Value Ref Range   Preg, Serum NEGATIVE  NEGATIVE   Comment:            THE SENSITIVITY OF THIS     METHODOLOGY IS >10 mIU/mL.   No results found.  Review of Systems  Constitutional: Negative for fever, chills, weight loss, malaise/fatigue and diaphoresis.  HENT: Negative.   Eyes: Negative.   Respiratory: Negative.   Cardiovascular: Negative.   Gastrointestinal: Negative.   Genitourinary: Negative.   Musculoskeletal: Positive for back pain. Negative for falls, joint pain, myalgias and neck  pain.  Skin: Negative for itching and rash.  Neurological: Positive for tingling, sensory change, focal weakness and weakness. Negative for dizziness, tremors, speech change, seizures and loss of consciousness.  Endo/Heme/Allergies: Negative.   Psychiatric/Behavioral: Negative.     Blood pressure 143/60, pulse 100, temperature 98.6 F (37 C), temperature source Oral, resp. rate 16, weight 59.421 kg (131 lb), SpO2 100.00%. Physical Exam  Constitutional: She is oriented to person, place, and time. She appears well-developed and well-nourished. She appears distressed.  HENT:  Head: Normocephalic and atraumatic.  Right Ear: External ear normal.  Left Ear: External ear normal.  Nose: Nose normal.  Mouth/Throat: Oropharynx is clear and moist. No oropharyngeal exudate.  Eyes: Conjunctivae and EOM are normal. Pupils are equal, round, and reactive to light. Right eye exhibits no discharge. Left eye exhibits no discharge. No scleral icterus.  Neck: Normal range of motion. Neck supple. No JVD present. No tracheal deviation present. No thyromegaly present.  Cardiovascular: Normal rate, regular rhythm, normal heart sounds and intact distal pulses.  Exam reveals no gallop and no friction rub.  No murmur heard. Respiratory: Effort normal and breath sounds normal. No stridor. No respiratory distress. She has no wheezes. She has no rales. She exhibits no tenderness.  GI: Soft. Bowel sounds are normal. She exhibits no distension and no mass. There is no tenderness. There is no rebound and no guarding.  Musculoskeletal: She exhibits no edema and no tenderness.  Lymphadenopathy:    She has no cervical adenopathy.  Neurological: She is alert and oriented to person, place, and time. She has normal reflexes. She displays normal reflexes. No cranial nerve deficit. She exhibits normal muscle tone. Coordination normal.  Skin: Skin is warm and dry. No rash noted. She is not diaphoretic. No erythema. No pallor.   Psychiatric: She has a normal mood and affect. Her behavior is normal. Judgment and thought content normal.     Assessment/Plan  ASSESSMENT:  This patient has undergone conservative management for a disk herniation now for almost 6 months.  She has had attempts at physical therapy and attempts at epidural steroid injections and is still requiring medicine to help relieve her pain.  She remains dysfunctional in terms of her ability to sleep well, to walk well, or to function well at home.  She relates that she is unable to perform her chores at home with any lifting, bending, or stooping.  At this point, she does have radicular signs in the right lower extremity and persistent sciatica.    PLAN:  She is a candidate for a right-sided MIS approach to a microdiscectomy.  I have discussed the risks and benefits and procedure with Cailee and her husband, and they wish to proceed.  The risks included the risk of infection, bleeding, and neurologic compromise.  The risk of infection is 1 in 300, and the risk of bleeding is minimal with less than a 1% chance of transfusion.  The risk of nerve root is 1 in 10,000.  The chance that this will relieve her pain into her leg is nearly 97%.  She may have some persistent back discomfort on a long-term basis mainly because she has what appears to be some mild degenerative changes.  We also discussed the possibility of developing instability at the L4-L5 level that has been studied in the past and may occur in the high numbers of 20% of people that undergo a microdiscectomy; however, 80% do not, and in general, I would recommend decompression first, and then if she develops concerns of instability at a later point, then a fusion at that time if necessary.  All questions were answered.  She wishes to proceed.  We will go ahead and schedule her to do this as an outpatient procedure at [facility] main OR.  Yordan Martindale E 11/08/2013, 12:43 PM

## 2013-11-07 MED ORDER — CEFAZOLIN SODIUM 1-5 GM-% IV SOLN
1.0000 g | INTRAVENOUS | Status: AC
  Administered 2013-11-08: 1 g via INTRAVENOUS
  Filled 2013-11-07: qty 50

## 2013-11-07 MED ORDER — BUPIVACAINE LIPOSOME 1.3 % IJ SUSP
20.0000 mL | Freq: Once | INTRAMUSCULAR | Status: DC
  Filled 2013-11-07 (×2): qty 20

## 2013-11-08 ENCOUNTER — Encounter (HOSPITAL_COMMUNITY): Payer: BC Managed Care – PPO | Admitting: Vascular Surgery

## 2013-11-08 ENCOUNTER — Ambulatory Visit (HOSPITAL_COMMUNITY)
Admission: RE | Admit: 2013-11-08 | Discharge: 2013-11-08 | Disposition: A | Discharge status: Home / Self Care | Payer: BC Managed Care – PPO | Source: Ambulatory Visit | Attending: Specialist | Admitting: Specialist

## 2013-11-08 ENCOUNTER — Ambulatory Visit (HOSPITAL_COMMUNITY): Payer: BC Managed Care – PPO | Admitting: Anesthesiology

## 2013-11-08 ENCOUNTER — Encounter (HOSPITAL_COMMUNITY): Payer: Self-pay | Admitting: *Deleted

## 2013-11-08 ENCOUNTER — Ambulatory Visit (HOSPITAL_COMMUNITY): Payer: BC Managed Care – PPO

## 2013-11-08 ENCOUNTER — Encounter (HOSPITAL_COMMUNITY)
Admission: RE | Disposition: A | Discharge status: Home / Self Care | Payer: BC Managed Care – PPO | Source: Ambulatory Visit | Attending: Specialist

## 2013-11-08 DIAGNOSIS — M5126 Other intervertebral disc displacement, lumbar region: Secondary | ICD-10-CM | POA: Insufficient documentation

## 2013-11-08 DIAGNOSIS — Y9339 Activity, other involving climbing, rappelling and jumping off: Secondary | ICD-10-CM | POA: Diagnosis not present

## 2013-11-08 DIAGNOSIS — M5416 Radiculopathy, lumbar region: Secondary | ICD-10-CM | POA: Diagnosis present

## 2013-11-08 DIAGNOSIS — E039 Hypothyroidism, unspecified: Secondary | ICD-10-CM | POA: Insufficient documentation

## 2013-11-08 HISTORY — PX: LUMBAR LAMINECTOMY: SHX95

## 2013-11-08 SURGERY — MICRODISCECTOMY LUMBAR LAMINECTOMY
Anesthesia: General | Site: Back

## 2013-11-08 MED ORDER — NEOSTIGMINE METHYLSULFATE 10 MG/10ML IV SOLN
INTRAVENOUS | Status: DC | PRN
  Administered 2013-11-08: 3 mg via INTRAVENOUS

## 2013-11-08 MED ORDER — THROMBIN 20000 UNITS EX SOLR
CUTANEOUS | Status: AC
  Filled 2013-11-08: qty 20000

## 2013-11-08 MED ORDER — CHLORHEXIDINE GLUCONATE 4 % EX LIQD
60.0000 mL | Freq: Once | CUTANEOUS | Status: DC
  Filled 2013-11-08: qty 60

## 2013-11-08 MED ORDER — METHOCARBAMOL 500 MG PO TABS: 500.0000 mg | ORAL_TABLET | Freq: Three times a day (TID) | ORAL | Status: DC | PRN

## 2013-11-08 MED ORDER — GLYCOPYRROLATE 0.2 MG/ML IJ SOLN
INTRAMUSCULAR | Status: DC | PRN
  Administered 2013-11-08: 0.4 mg via INTRAVENOUS

## 2013-11-08 MED ORDER — MIDAZOLAM HCL 5 MG/5ML IJ SOLN
INTRAMUSCULAR | Status: DC | PRN
  Administered 2013-11-08: 2 mg via INTRAVENOUS

## 2013-11-08 MED ORDER — THROMBIN 20000 UNITS EX KIT
PACK | CUTANEOUS | Status: DC | PRN
  Administered 2013-11-08: 14:00:00 via TOPICAL

## 2013-11-08 MED ORDER — ONDANSETRON HCL 4 MG/2ML IJ SOLN: 4.0000 mg | INTRAMUSCULAR | Status: DC | PRN

## 2013-11-08 MED ORDER — LACTATED RINGERS IV SOLN
INTRAVENOUS | Status: DC | PRN
  Administered 2013-11-08: 13:00:00 via INTRAVENOUS

## 2013-11-08 MED ORDER — NEOSTIGMINE METHYLSULFATE 10 MG/10ML IV SOLN
INTRAVENOUS | Status: AC
  Filled 2013-11-08: qty 1

## 2013-11-08 MED ORDER — BUPIVACAINE HCL 0.5 % IJ SOLN
INTRAMUSCULAR | Status: DC | PRN
  Administered 2013-11-08: 10 mL

## 2013-11-08 MED ORDER — PROPOFOL 10 MG/ML IV BOLUS
INTRAVENOUS | Status: DC | PRN
  Administered 2013-11-08: 170 mg via INTRAVENOUS
  Administered 2013-11-08: 30 mg via INTRAVENOUS

## 2013-11-08 MED ORDER — ROCURONIUM BROMIDE 100 MG/10ML IV SOLN
INTRAVENOUS | Status: DC | PRN
  Administered 2013-11-08: 50 mg via INTRAVENOUS

## 2013-11-08 MED ORDER — FENTANYL CITRATE 0.05 MG/ML IJ SOLN
INTRAMUSCULAR | Status: DC | PRN
  Administered 2013-11-08 (×2): 100 ug via INTRAVENOUS
  Administered 2013-11-08: 50 ug via INTRAVENOUS

## 2013-11-08 MED ORDER — DIPHENHYDRAMINE HCL 50 MG/ML IJ SOLN: 10.0000 mg | Freq: Once | INTRAMUSCULAR | Status: DC

## 2013-11-08 MED ORDER — SCOPOLAMINE 1 MG/3DAYS TD PT72
MEDICATED_PATCH | TRANSDERMAL | Status: DC | PRN
  Administered 2013-11-08: 1 via TRANSDERMAL

## 2013-11-08 MED ORDER — ARTIFICIAL TEARS OP OINT
TOPICAL_OINTMENT | OPHTHALMIC | Status: DC | PRN
  Administered 2013-11-08: 1 via OPHTHALMIC

## 2013-11-08 MED ORDER — GLYCOPYRROLATE 0.2 MG/ML IJ SOLN
INTRAMUSCULAR | Status: AC
  Filled 2013-11-08: qty 2

## 2013-11-08 MED ORDER — PROCHLORPERAZINE MALEATE 5 MG PO TABS: 5.0000 mg | ORAL_TABLET | Freq: Four times a day (QID) | ORAL | Status: DC | PRN

## 2013-11-08 MED ORDER — ONDANSETRON HCL 4 MG/2ML IJ SOLN
INTRAMUSCULAR | Status: DC | PRN
  Administered 2013-11-08: 4 mg via INTRAVENOUS

## 2013-11-08 MED ORDER — LIDOCAINE HCL 4 % MT SOLN
OROMUCOSAL | Status: DC | PRN
  Administered 2013-11-08: 2 mL via TOPICAL

## 2013-11-08 MED ORDER — ONDANSETRON HCL 4 MG/2ML IJ SOLN: 4.0000 mg | Freq: Once | INTRAMUSCULAR | Status: DC | PRN

## 2013-11-08 MED ORDER — DIPHENHYDRAMINE HCL 50 MG/ML IJ SOLN
INTRAMUSCULAR | Status: AC
  Filled 2013-11-08: qty 1

## 2013-11-08 MED ORDER — BUPIVACAINE LIPOSOME 1.3 % IJ SUSP
INTRAMUSCULAR | Status: DC | PRN
  Administered 2013-11-08: 10 mL

## 2013-11-08 MED ORDER — 0.9 % SODIUM CHLORIDE (POUR BTL) OPTIME
TOPICAL | Status: DC | PRN
  Administered 2013-11-08: 1000 mL

## 2013-11-08 MED ORDER — SCOPOLAMINE 1 MG/3DAYS TD PT72
MEDICATED_PATCH | TRANSDERMAL | Status: AC
  Filled 2013-11-08: qty 1

## 2013-11-08 MED ORDER — ROCURONIUM BROMIDE 50 MG/5ML IV SOLN
INTRAVENOUS | Status: AC
  Filled 2013-11-08: qty 2

## 2013-11-08 MED ORDER — DEXAMETHASONE SODIUM PHOSPHATE 4 MG/ML IJ SOLN
INTRAMUSCULAR | Status: AC
  Filled 2013-11-08: qty 1

## 2013-11-08 MED ORDER — MIDAZOLAM HCL 2 MG/2ML IJ SOLN
INTRAMUSCULAR | Status: AC
  Filled 2013-11-08: qty 2

## 2013-11-08 MED ORDER — HYDROMORPHONE HCL 1 MG/ML IJ SOLN: 0.2500 mg | INTRAMUSCULAR | Status: DC | PRN

## 2013-11-08 MED ORDER — HYDROCODONE-ACETAMINOPHEN 5-325 MG PO TABS: 1.0000 | ORAL_TABLET | ORAL | Status: DC | PRN

## 2013-11-08 MED ORDER — METHOCARBAMOL 500 MG PO TABS
500.0000 mg | ORAL_TABLET | Freq: Four times a day (QID) | ORAL | Status: DC | PRN
  Filled 2013-11-08: qty 1

## 2013-11-08 MED ORDER — LACTATED RINGERS IV SOLN
INTRAVENOUS | Status: DC
  Administered 2013-11-08: 11:00:00 via INTRAVENOUS

## 2013-11-08 MED ORDER — PROPOFOL 10 MG/ML IV BOLUS
INTRAVENOUS | Status: AC
  Filled 2013-11-08: qty 20

## 2013-11-08 MED ORDER — FENTANYL CITRATE 0.05 MG/ML IJ SOLN
INTRAMUSCULAR | Status: AC
  Filled 2013-11-08: qty 5

## 2013-11-08 MED ORDER — SCOPOLAMINE 1 MG/3DAYS TD PT72: 1.0000 | MEDICATED_PATCH | Freq: Once | TRANSDERMAL | Status: DC

## 2013-11-08 MED ORDER — DEXAMETHASONE SODIUM PHOSPHATE 10 MG/ML IJ SOLN
INTRAMUSCULAR | Status: DC | PRN
  Administered 2013-11-08: 4 mg via INTRAVENOUS

## 2013-11-08 MED ORDER — METHOCARBAMOL 1000 MG/10ML IJ SOLN
500.0000 mg | Freq: Four times a day (QID) | INTRAMUSCULAR | Status: DC | PRN
  Filled 2013-11-08: qty 5

## 2013-11-08 MED ORDER — DIPHENHYDRAMINE HCL 50 MG/ML IJ SOLN
INTRAMUSCULAR | Status: DC | PRN
  Administered 2013-11-08: 10 mg via INTRAVENOUS

## 2013-11-08 SURGICAL SUPPLY — 56 items
ADH SKN CLS APL DERMABOND .7 (GAUZE/BANDAGES/DRESSINGS) ×1
BUR MATCHSTICK NEURO 3.0 LAGG (BURR) ×2 IMPLANT
BUR ROUND FLUTED 4 SOFT TCH (BURR) IMPLANT
BUR ROUND FLUTED 4MM SOFT TCH (BURR)
CANISTER SUCTION 2500CC (MISCELLANEOUS) ×3 IMPLANT
CORDS BIPOLAR (ELECTRODE) ×3 IMPLANT
COVER MAYO STAND STRL (DRAPES) ×3 IMPLANT
COVER SURGICAL LIGHT HANDLE (MISCELLANEOUS) ×3 IMPLANT
DERMABOND ADVANCED (GAUZE/BANDAGES/DRESSINGS) ×2
DERMABOND ADVANCED .7 DNX12 (GAUZE/BANDAGES/DRESSINGS) ×1 IMPLANT
DRAPE C-ARM 42X72 X-RAY (DRAPES) ×3 IMPLANT
DRAPE MICROSCOPE LEICA (MISCELLANEOUS) ×3 IMPLANT
DRAPE POUCH INSTRU U-SHP 10X18 (DRAPES) ×3 IMPLANT
DRAPE PROXIMA HALF (DRAPES) ×4 IMPLANT
DRAPE SURG 17X23 STRL (DRAPES) ×9 IMPLANT
DRSG MEPILEX BORDER 4X4 (GAUZE/BANDAGES/DRESSINGS) ×2 IMPLANT
DRSG MEPILEX BORDER 4X8 (GAUZE/BANDAGES/DRESSINGS) IMPLANT
DURAPREP 26ML APPLICATOR (WOUND CARE) ×3 IMPLANT
ELECT BLADE 4.0 EZ CLEAN MEGAD (MISCELLANEOUS) ×3
ELECT CAUTERY BLADE 6.4 (BLADE) ×3 IMPLANT
ELECT REM PT RETURN 9FT ADLT (ELECTROSURGICAL) ×3
ELECTRODE BLDE 4.0 EZ CLN MEGD (MISCELLANEOUS) ×1 IMPLANT
ELECTRODE REM PT RTRN 9FT ADLT (ELECTROSURGICAL) ×1 IMPLANT
GLOVE BIOGEL PI IND STRL 7.5 (GLOVE) ×1 IMPLANT
GLOVE BIOGEL PI INDICATOR 7.5 (GLOVE) ×2
GLOVE ECLIPSE 7.0 STRL STRAW (GLOVE) ×3 IMPLANT
GLOVE ECLIPSE 9.0 STRL (GLOVE) ×3 IMPLANT
GLOVE SURG 8.5 LATEX PF (GLOVE) ×3 IMPLANT
GOWN STRL REUS W/ TWL LRG LVL3 (GOWN DISPOSABLE) ×2 IMPLANT
GOWN STRL REUS W/TWL 2XL LVL3 (GOWN DISPOSABLE) ×3 IMPLANT
GOWN STRL REUS W/TWL LRG LVL3 (GOWN DISPOSABLE) ×6
GOWN STRL REUS W/TWL XL LVL3 (GOWN DISPOSABLE) ×3 IMPLANT
KIT BASIN OR (CUSTOM PROCEDURE TRAY) ×3 IMPLANT
KIT ROOM TURNOVER OR (KITS) ×3 IMPLANT
NDL SPNL 18GX3.5 QUINCKE PK (NEEDLE) ×2 IMPLANT
NEEDLE 22X1 1/2 (OR ONLY) (NEEDLE) ×3 IMPLANT
NEEDLE SPNL 18GX3.5 QUINCKE PK (NEEDLE) ×6 IMPLANT
NS IRRIG 1000ML POUR BTL (IV SOLUTION) ×3 IMPLANT
PACK LAMINECTOMY ORTHO (CUSTOM PROCEDURE TRAY) ×3 IMPLANT
PAD ARMBOARD 7.5X6 YLW CONV (MISCELLANEOUS) ×6 IMPLANT
PATTIES SURGICAL .5 X.5 (GAUZE/BANDAGES/DRESSINGS) IMPLANT
PATTIES SURGICAL .75X.75 (GAUZE/BANDAGES/DRESSINGS) ×2 IMPLANT
SPONGE LAP 4X18 X RAY DECT (DISPOSABLE) IMPLANT
SPONGE SURGIFOAM ABS GEL 100 (HEMOSTASIS) ×2 IMPLANT
SUT VIC AB 1 CT1 27 (SUTURE) ×3
SUT VIC AB 1 CT1 27XBRD ANBCTR (SUTURE) IMPLANT
SUT VIC AB 2-0 CT1 27 (SUTURE) ×3
SUT VIC AB 2-0 CT1 TAPERPNT 27 (SUTURE) IMPLANT
SUT VICRYL 0 UR6 27IN ABS (SUTURE) ×2 IMPLANT
SUT VICRYL 4-0 PS2 18IN ABS (SUTURE) ×2 IMPLANT
SYR 20CC LL (SYRINGE) ×3 IMPLANT
SYR CONTROL 10ML LL (SYRINGE) ×3 IMPLANT
TOWEL OR 17X24 6PK STRL BLUE (TOWEL DISPOSABLE) ×3 IMPLANT
TOWEL OR 17X26 10 PK STRL BLUE (TOWEL DISPOSABLE) ×3 IMPLANT
TRAY FOLEY CATH 16FRSI W/METER (SET/KITS/TRAYS/PACK) IMPLANT
WATER STERILE IRR 1000ML POUR (IV SOLUTION) ×3 IMPLANT

## 2013-11-08 NOTE — Brief Op Note (Signed)
11/08/2013  2:50 PM  PATIENT:  Christy Blake  38 y.o. female  PRE-OPERATIVE DIAGNOSIS:   Right L4-5 herniated nucleus pulposus  POST-OPERATIVE DIAGNOSIS:   Right L4-5 herniated nucleus pulposus  PROCEDURE:  Procedure(s): RIGHT L4-5 MICRODISCECTOMY (N/A)  SURGEON:  Surgeon(s) and Role:    * Kerrin ChampagneJames E Nitka, MD - Primary   ASSISTANTS: RNFA, Jeanie   ANESTHESIA:   general  EBL:  Total I/O In: 1700 [I.V.:1700] Out: -   BLOOD ADMINISTERED:none  DRAINS: none   LOCAL MEDICATIONS USED:  MARCAINE1/2% 1:1 EXPAREL1.3%, Amount: 20ml   SPECIMEN:  No Specimen  DISPOSITION OF SPECIMEN:  N/A  COUNTS:  YES  TOURNIQUET:  * No tourniquets in log *  DICTATION: .Dragon Dictation  PLAN OF CARE: Discharge to home after PACU  PATIENT DISPOSITION:  PACU - hemodynamically stable.   Delay start of Pharmacological VTE agent (>24hrs) due to surgical blood loss or risk of bleeding: yes

## 2013-11-08 NOTE — Discharge Instructions (Addendum)
No lifting greater than 10 lbs. Avoid bending, stooping and twisting. Walk in house for first week them may start to get out slowly increasing distance up to one mile by 6 weeks post op. Keep incision dry for 3 days, may use tegaderm or similar water impervious dressing.   What to eat:  For your first meals, you should eat lightly; only small meals initially.  If you do not have nausea, you may eat larger meals.  Avoid spicy, greasy and heavy food.    General Anesthesia, Adult, Care After  Refer to this sheet in the next few weeks. These instructions provide you with information on caring for yourself after your procedure. Your health care provider may also give you more specific instructions. Your treatment has been planned according to current medical practices, but problems sometimes occur. Call your health care provider if you have any problems or questions after your procedure.  WHAT TO EXPECT AFTER THE PROCEDURE  After the procedure, it is typical to experience:  Sleepiness.  Nausea and vomiting. HOME CARE INSTRUCTIONS  For the first 24 hours after general anesthesia:  Have a responsible person with you.  Do not drive a car. If you are alone, do not take public transportation.  Do not drink alcohol.  Do not take medicine that has not been prescribed by your health care provider.  Do not sign important papers or make important decisions.  You may resume a normal diet and activities as directed by your health care provider.  Change bandages (dressings) as directed.  If you have questions or problems that seem related to general anesthesia, call the hospital and ask for the anesthetist or anesthesiologist on call. SEEK MEDICAL CARE IF:  You have nausea and vomiting that continue the day after anesthesia.  You develop a rash. SEEK IMMEDIATE MEDICAL CARE IF:  You have difficulty breathing.  You have chest pain.  You have any allergic problems. Document Released: 04/18/2000 Document  Revised: 09/12/2012 Document Reviewed: 07/26/2012  [facility] Patient Information 2014 [employer], [place].

## 2013-11-08 NOTE — Anesthesia Procedure Notes (Signed)
Procedure Name: Intubation Date/Time: 11/08/2013 1:02 PM Performed by: Izora Gala Pre-anesthesia Checklist: Patient identified, Emergency Drugs available, Suction available and Patient being monitored Patient Re-evaluated:Patient Re-evaluated prior to inductionOxygen Delivery Method: Circle system utilized Preoxygenation: Pre-oxygenation with 100% oxygen Intubation Type: IV induction Ventilation: Mask ventilation without difficulty Laryngoscope Size: Miller and 3 Grade View: Grade I Tube type: Oral Tube size: 7.5 mm Number of attempts: 1 Airway Equipment and Method: Stylet and LTA kit utilized Placement Confirmation: ETT inserted through vocal cords under direct vision,  positive ETCO2 and breath sounds checked- equal and bilateral Secured at: 22 cm Tube secured with: Tape Dental Injury: Teeth and Oropharynx as per pre-operative assessment

## 2013-11-08 NOTE — Anesthesia Postprocedure Evaluation (Signed)
  Anesthesia Post-op Note  Patient: Christy Blake  Procedure(s) Performed: Procedure(s): RIGHT L4-5 MICRODISCECTOMY (N/A)  Patient Location: PACU  Anesthesia Type:General  Level of Consciousness: awake, alert , oriented and patient cooperative  Airway and Oxygen Therapy: Patient Spontanous Breathing  Post-op Pain: none  Post-op Assessment: Post-op Vital signs reviewed, Patient's Cardiovascular Status Stable, Respiratory Function Stable, Patent Airway, No signs of Nausea or vomiting and Pain level controlled  Post-op Vital Signs: Reviewed and stable  Last Vitals:  Filed Vitals:   11/08/13 1542  BP: 117/62  Pulse: 61  Temp: 36.3 C  Resp:     Complications: No apparent anesthesia complications

## 2013-11-08 NOTE — Transfer of Care (Signed)
Immediate Anesthesia Transfer of Care Note  Patient: Christy Blake  Procedure(s) Performed: Procedure(s): RIGHT L4-5 MICRODISCECTOMY (N/A)  Patient Location: PACU  Anesthesia Type:General  Level of Consciousness: awake, alert , oriented and patient cooperative  Airway & Oxygen Therapy: Patient Spontanous Breathing and Patient connected to nasal cannula oxygen  Post-op Assessment: Report given to PACU RN, Patient moving all extremities and Patient moving all extremities X 4  Post vital signs: Reviewed and stable  Complications: No apparent anesthesia complications

## 2013-11-08 NOTE — Progress Notes (Signed)
Called Dr.Jackson for sign out  

## 2013-11-08 NOTE — Interval H&P Note (Signed)
History and Physical Interval Note:  11/08/2013 12:44 PM  Christy Blake  has presented today for surgery, with the diagnosis of  Right L4-5 herniated nucleus pulposus  The various methods of treatment have been discussed with the patient and family. After consideration of risks, benefits and other options for treatment, the patient has consented to  Procedure(s): RIGHT L4-5 MICRODISCECTOMY (N/A) as a surgical intervention .  The patient's history has been reviewed, patient examined, no change in status, stable for surgery.  I have reviewed the patient's chart and labs.  Questions were answered to the patient's satisfaction.     NITKA,JAMES E

## 2013-11-08 NOTE — Op Note (Addendum)
11/08/2013  3:23 PM  PATIENT:  Christy Blake  38 y.o. female  MRN: [id number]  OPERATIVE REPORT  PRE-OPERATIVE DIAGNOSIS:   Right L4-5 herniated nucleus pulposus  POST-OPERATIVE DIAGNOSIS:   Right L4-5 herniated nucleus pulposus  PROCEDURE:  Procedure(s): RIGHT L4-5 MICRODISCECTOMY    SURGEON:  Jessy Oto, MD     ASSISTANTNoreene Larsson, RNFA    ANESTHESIA:  General,supplemented with local Marcaine half percent 1:1 Exparel 1.3% total of 20 cc used, Dr. Linna Caprice.    COMPLICATIONS:  None.   EBL <30cc     PROCEDURE:The patient was met in the holding area, and the appropriate right Lumbar level L4-5 identified and marked with "x" and my initials.The patient was then transported to OR and was placed under general anesthesia without difficulty. The patient received appropriate preoperative antibiotic prophylaxis. The patient after intubation atraumatically was transferred to the operating room table, prone position, Wilson frame, sliding OR table. All pressure points were well padded. The arms in 90-90 well-padded at the elbows. Standard prep with Iodophorm to the lower dorsal spine to the mid sacral segment. Draped in the usual manner clear Vi-Drape was used. Time-out procedure was called and correct. An 18-gauge spinal needle was then inserted at the expected L4-5 level. Lateral cross table radiograph used to identify the spinal needle position. The needle was at the lower aspect of the lamina of L4. Skin was then infiltrated with Marcaine half percent 1:1 Exparel 1.3% total of 9 cc used. An incision approximately an inch inch and a half in length was then made through skin and subcutaneous layers in line with the left side of the expected midline just superior to the spinal needle entry point. An incision made into the right lumbosacral fascia approximately an inch in length .  Smallest dilator was then introduced into the incision site and used to carefully form subperiosteal dissection of the  paralumbar muscles off of the posterior lamina of the expected L4-5 level. Successive dilators were then carried up to the 11 mm size. The depth measured off of the dilators at about 40 mm and 40 mm retractors then placed on the scaffolding for the MIS equipment and guided over dilators down to and docking on the posterior aspect of the lamina at the expected L4-5 level. This was sterilely attached to the articulating arm and it's up right which had been attached the OR table sterilely. C-arm fluoroscopy was identified the dilators and the retractors at the appropriate level L4-5. The operating room microscope sterilely draped brought into the field. Under the operating room microscope, the L4-5 interspace carefully debrided the small amount of muscle attachment here and high-speed bur used to drill the medial aspect of the inferior articular process of L4 approximately 20%. A localization lateral C-arm view was obtained with Penfield 4 in the L4-5 facet. 2 mm Kerrison then used to enter the spinal canal over the superior aspect of the L5 lamina carefully using the Kerrison to debris the attachment as a curet. Foraminotomy was then performed over the right L5 nerve root. The medial 10% superior articular process of L5 and then resected using 2 mm Kerrison. This allowed for identification of the thecal sac. Penfield 4 was then used to carefully mobilize the thecal sac medially and the L5 nerve root identified within the lateral recess flattened over the posterior aspect of the protruded disc. Carefully the lateral aspect of the L5 nerve root was identified and a Gaspar Garbe 4 was used to mobilize the  nerve medially such that the protruded disc was visible with microscope. Using a Penfield 4 for retraction and a 15 blade scalpel was used to incise the posterior longitudinal ligament within the lateral recess on the right side longitudinally. Disc material was removed using micropituitary rongeurs and nerve hook nerve  root and then more easily able to be mobilized medially and retracted using a Derricho retractor. Further foraminotomies was performed over the L5 nerve root the nerve root was noted to be X. without further compression. The nerve root able to be retracted along the medial aspect of the L5 pedicle and disc material found to be subligamentous at this level was further resected current pituitary rongeurs.  Ligamentum flavum was further debrided superiorly to the level L4-5 disc. Had a moderate amount of further resection of the L4 lamina inferiorly and mediallywas performed. With this then the disc space at L4-5 was easily visualized and entry into the disc at the sided disc herniation was possible using a Penfield 4 intraoperative Lateral radiograph was used to identify the L4-5 disc with the Penfield 4 at the disc space. Micropituitary was used to further debride this material superficially from the posterior aspect of the intervertebral disc is posterior lateral aspect of the disc. Small amount of further disc material was found subligamentous extending laterally from the disc this was removed using micropituitary rongeurs into the left L4 neuroforamen additionally epstein  currettes were used to decompress the right L4 neuroforamen. Upbiting currettes were used to further remove reflected portions of the reflected portions of the medial L4-5 facet and portions of the superior portion of the L5 superior articular  Facet. Ligamentum flavum was debrided and lateral recess along the medial aspect L4-5 facet no further decompression was necessary. Ball tip nerve probe was then able to carefully palpate the neuroforamen for L4 and L5 finding these to be well decompressed. Bleeding was then controlled using thrombin-soaked Gelfoam small cottonoids. Small amount of bleeding within the soft tissue mass the laminotomy area was controlled using bipolar electrocautery. Irrigation was carried out using copious amounts of  irrigant solution. All Gelfoam were then removed. No significant active bleeding present at the time of removal. All instruments sponge counts were correct traction system was then carefully removed carefully rotating retractors with this withdrawal and only bipolar electrocautery of any small bleeders. Lumbodorsal fascia was then carefully approximated with interrupted 0 Vicryl sutures, UR 6 needle deep subcutaneous layers were approximated with interrupted 0 Vicryl sutures on UR 6 the appear subcutaneous layers approximated with interrupted 2-0 Vicryl sutures and the skin closed with a running subcutaneous stitch of 4-0 Vicryl. Dermabond was applied allowed to dry and then Mepilex bandage applied. Patient was then carefully returned to supine position on a stretcher, reactivated and extubated. He was then returned to recovery room in satisfactory condition. [facility] RNFA  perform the duties of Pensions consultant during this case. She was present from the beginning of the case to the end of the case assisting in transfer the patient from his stretcher to the OR table and back to the stretcher at the end of the case. Assisted in careful retraction and suction of the laminectomy site delicate neural structures operating under the operating room microscope. She performed closure of the incision from the fascia to the skin applying the dressing.     NITKA,JAMES E  11/08/2013, 3:23 PM  q

## 2013-11-08 NOTE — Anesthesia Preprocedure Evaluation (Signed)
Anesthesia Evaluation  Patient identified by MRN, date of birth, ID band Patient awake    Reviewed: Allergy & Precautions, H&P , NPO status , Patient's Chart, lab work & pertinent test results  Airway Mallampati: II TM Distance: >3 FB Neck ROM: Full    Dental  (+) Teeth Intact, Dental Advisory Given   Pulmonary  breath sounds clear to auscultation        Cardiovascular Rhythm:Regular Rate:Normal     Neuro/Psych    GI/Hepatic   Endo/Other    Renal/GU      Musculoskeletal   Abdominal   Peds  Hematology   Anesthesia Other Findings   Reproductive/Obstetrics                           Anesthesia Physical Anesthesia Plan  ASA: I  Anesthesia Plan: General   Post-op Pain Management:    Induction: Intravenous  Airway Management Planned: Oral ETT  Additional Equipment:   Intra-op Plan:   Post-operative Plan: Extubation in OR  Informed Consent: I have reviewed the patients History and Physical, chart, labs and discussed the procedure including the risks, benefits and alternatives for the proposed anesthesia with the patient or authorized representative who has indicated his/her understanding and acceptance.   Dental advisory given  Plan Discussed with: CRNA and Anesthesiologist  Anesthesia Plan Comments:         Anesthesia Quick Evaluation

## 2013-11-11 MED FILL — Thrombin For Soln 20000 Unit: CUTANEOUS | Qty: 1 | Status: AC

## 2013-11-12 ENCOUNTER — Encounter (HOSPITAL_COMMUNITY): Payer: Self-pay | Admitting: Specialist

## 2014-09-15 ENCOUNTER — Other Ambulatory Visit: Payer: Self-pay | Admitting: Obstetrics and Gynecology

## 2014-09-15 DIAGNOSIS — R928 Other abnormal and inconclusive findings on diagnostic imaging of breast: Secondary | ICD-10-CM

## 2014-09-19 ENCOUNTER — Ambulatory Visit
Admission: RE | Admit: 2014-09-19 | Discharge: 2014-09-19 | Disposition: A | Discharge status: Home / Self Care | Payer: BLUE CROSS/BLUE SHIELD | Source: Ambulatory Visit | Attending: Obstetrics and Gynecology | Admitting: Obstetrics and Gynecology

## 2014-09-19 DIAGNOSIS — R928 Other abnormal and inconclusive findings on diagnostic imaging of breast: Secondary | ICD-10-CM

## 2015-07-02 ENCOUNTER — Emergency Department (HOSPITAL_BASED_OUTPATIENT_CLINIC_OR_DEPARTMENT_OTHER): Payer: BLUE CROSS/BLUE SHIELD

## 2015-07-02 ENCOUNTER — Emergency Department (HOSPITAL_BASED_OUTPATIENT_CLINIC_OR_DEPARTMENT_OTHER)
Admission: EM | Admit: 2015-07-02 | Discharge: 2015-07-02 | Disposition: A | Discharge status: Home / Self Care | Payer: BLUE CROSS/BLUE SHIELD | Attending: Emergency Medicine | Admitting: Emergency Medicine

## 2015-07-02 ENCOUNTER — Encounter (HOSPITAL_BASED_OUTPATIENT_CLINIC_OR_DEPARTMENT_OTHER): Payer: Self-pay | Admitting: Emergency Medicine

## 2015-07-02 DIAGNOSIS — R079 Chest pain, unspecified: Secondary | ICD-10-CM | POA: Diagnosis present

## 2015-07-02 DIAGNOSIS — Z79899 Other long term (current) drug therapy: Secondary | ICD-10-CM | POA: Insufficient documentation

## 2015-07-02 DIAGNOSIS — E039 Hypothyroidism, unspecified: Secondary | ICD-10-CM | POA: Insufficient documentation

## 2015-07-02 DIAGNOSIS — J984 Other disorders of lung: Secondary | ICD-10-CM

## 2015-07-02 DIAGNOSIS — J8410 Pulmonary fibrosis, unspecified: Secondary | ICD-10-CM | POA: Insufficient documentation

## 2015-07-02 LAB — CBC
HCT: 38.4 % (ref 36.0–46.0)
HEMOGLOBIN: 13.4 g/dL (ref 12.0–15.0)
MCH: 31.7 pg (ref 26.0–34.0)
MCHC: 34.9 g/dL (ref 30.0–36.0)
MCV: 90.8 fL (ref 78.0–100.0)
Platelets: 170 10*3/uL (ref 150–400)
RBC: 4.23 MIL/uL (ref 3.87–5.11)
RDW: 12.2 % (ref 11.5–15.5)
WBC: 6.5 10*3/uL (ref 4.0–10.5)

## 2015-07-02 LAB — BASIC METABOLIC PANEL
Anion gap: 7 (ref 5–15)
BUN: 9 mg/dL (ref 6–20)
CO2: 24 mmol/L (ref 22–32)
Calcium: 9.1 mg/dL (ref 8.9–10.3)
Chloride: 106 mmol/L (ref 101–111)
Creatinine, Ser: 0.65 mg/dL (ref 0.44–1.00)
GFR calc Af Amer: >60 mL/min (ref >60)
GLUCOSE: 88 mg/dL (ref 65–99)
POTASSIUM: 3.8 mmol/L (ref 3.5–5.1)
Sodium: 137 mmol/L (ref 135–145)

## 2015-07-02 LAB — TROPONIN I: Troponin I: <0.03 ng/mL (ref <0.031)

## 2015-07-02 NOTE — ED Notes (Signed)
Pt a/ox4 sitting up in bed with NAD. Pt states she is not currently having symptoms at this time. Pt states when she did get the chest pains she had some ShOB stating, "it felt like I had to take deep breaths to get air."

## 2015-07-02 NOTE — ED Notes (Addendum)
Pt reports pain to left upper chest 2 separate occurences this week on 2 separate occasions,no nausea, vomiting or diaphoresis,  no pain at this time, but is wanting to be worked up for chest pain

## 2015-07-02 NOTE — ED Provider Notes (Signed)
CSN: [id number]     Arrival date & time 07/02/15  1652 History   First MD Initiated Contact with Patient 07/02/15 1740     Chief Complaint  Patient presents with  . Anxiety     (Consider location/radiation/quality/duration/timing/severity/associated sxs/prior Treatment) HPI This is a 40 year old female presents emergency department with chief complaint of left-sided chest pain. Patient states that 2 days ago. Around 7 AM she was helping her child get dressed when she had sudden onset of severe pain on the left lateral part of her chest which she describes as squeezing and sharp, shooting to her left arm. She states that it lasted for about 30 seconds and then resolved. Patient states that around 12:00 PM, she had another episode of the same. However, it lasted for a solid minute. She denies cough, shortness of breath, diaphoresis, nausea. Cardiac risk factors include only father with heart attack in mid 47s. She denies exogenous estrogen use, unilateral leg swelling or pain, history of PE or blood clots, recent confinement or surgeries. She has not had any repeat episodes of the same.  Past Medical History  Diagnosis Date  . Hypothyroidism    Past Surgical History  Procedure Laterality Date  . Cardiac surgery      40 yrs old -pda no adult problems  . Cesarean section      x3  . Tubal ligation    . Lumbar laminectomy N/A 11/08/2013    Procedure: RIGHT L4-5 MICRODISCECTOMY;  Surgeon: Kerrin Champagne, MD;  Location: [facility] OR;  Service: Orthopedics;  Laterality: N/A;   History reviewed. No pertinent family history. Social History  Substance Use Topics  . Smoking status: Never Smoker   . Smokeless tobacco: None  . Alcohol Use: 1.5 oz/week    3 Standard drinks or equivalent per week   OB History    No data available     Review of Systems  Ten systems reviewed and are negative for acute change, except as noted in the HPI.    Allergies  Review of patient's allergies indicates no known  allergies.  Home Medications   Prior to Admission medications   Medication Sig Start Date End Date Taking? Authorizing Provider  levothyroxine (SYNTHROID, LEVOTHROID) 25 MCG tablet Take 25 mcg by mouth daily before breakfast.   Yes Historical Provider, MD   BP 112/57 mmHg  Pulse 79  Temp(Src) 98.1 F (36.7 C) (Oral)  Resp 17  Ht  (1.676 m)  Wt 65.772 kg  BMI 23.41 kg/m2  SpO2 100% Physical Exam  Constitutional: She is oriented to person, place, and time. She appears well-developed and well-nourished. No distress.  HENT:  Head: Normocephalic and atraumatic.  Eyes: Conjunctivae are normal. No scleral icterus.  Neck: Normal range of motion.  Cardiovascular: Normal rate, regular rhythm and normal heart sounds.  Exam reveals no gallop and no friction rub.   No murmur heard. Pulmonary/Chest: Effort normal and breath sounds normal. No respiratory distress. She exhibits no tenderness.  Abdominal: Soft. Bowel sounds are normal. She exhibits no distension and no mass. There is no tenderness. There is no guarding.  Neurological: She is alert and oriented to person, place, and time.  Skin: Skin is warm and dry. No rash noted. She is not diaphoretic.  Nursing note and vitals reviewed.   ED Course  Procedures (including critical care time) Labs Review Labs Reviewed  BASIC METABOLIC PANEL  CBC  TROPONIN I    Imaging Review No results found. I have personally reviewed  and evaluated these images and lab results as part of my medical decision-making.   EKG Interpretation   Date/Time:  Thursday July 02 2015 16:56:19 EDT Ventricular Rate:  83 PR Interval:  96 QRS Duration: 82 QT Interval:  378 QTC Calculation: 444 R Axis:   53 Text Interpretation:  Sinus rhythm with short PR with frequent Premature  ventricular complexes Otherwise normal ECG Confirmed by YELVERTON  MD,  DAVID ([id number]) on 07/03/2015 5:18:01 PM      MDM   Final diagnoses:  Chest pain with low risk for  cardiac etiology  Calcified granuloma of lung [facility](HCC)    Patient with HEART score 1. PERC negative. Her EKG is unremarkable. She has a hx of spinal DDD, and I siuspect her sxs are related to radiculopathy. Labs are without sig. Abnormality.   Patient is to be discharged with recommendation to follow up with PCP in regards to today's hospital visit. Chest pain is not likely of cardiac or pulmonary etiology d/t presentation, perc negative, VSS, no tracheal deviation, no JVD or new murmur, RRR, breath sounds equal bilaterally, EKG without acute abnormalities, negative troponin, and negative CXR. Pt has been advised start a PPI and return to the ED is CP becomes exertional, associated with diaphoresis or nausea, radiates to left jaw/arm, worsens or becomes concerning in any way. Pt appears reliable for follow up and is agreeable to discharge.       Arthor Captainbigail Dre Gamino, PA-C 07/05/15 1836  Rolland PorterMark James, MD 07/18/15 1330

## 2015-07-02 NOTE — ED Notes (Signed)
Pt states she remains symptom free.

## 2015-07-02 NOTE — ED Notes (Signed)
Patient transported to X-ray 

## 2015-07-02 NOTE — Discharge Instructions (Signed)
Your caregiver has diagnosed you as having chest pain that is not specific for one problem, but does not require admission.  You are at low risk for an acute heart condition or other serious illness. Chest pain comes from many different causes.  °SEEK IMMEDIATE MEDICAL ATTENTION IF: °You have severe chest pain, especially if the pain is crushing or pressure-like and spreads to the arms, back, neck, or jaw, or if you have sweating, nausea (feeling sick to your stomach), or shortness of breath. THIS IS AN EMERGENCY. Don't wait to see if the pain will go away. Get medical help at once. Call 911 or 0 (operator). DO NOT drive yourself to the hospital.  °Your chest pain gets worse and does not go away with rest.  °You have an attack of chest pain lasting longer than usual, despite rest and treatment with the medications your caregiver has prescribed.  °You wake from sleep with chest pain or shortness of breath.  °You feel dizzy or faint.  °You have chest pain not typical of your usual pain for which you originally saw your caregiver. ° ° °Nonspecific Chest Pain  °Chest pain can be caused by many different conditions. There is always a chance that your pain could be related to something serious, such as a heart attack or a blood clot in your lungs. Chest pain can also be caused by conditions that are not life-threatening. If you have chest pain, it is very important to follow up with your health care provider. °CAUSES  °Chest pain can be caused by: °· Heartburn. °· Pneumonia or bronchitis. °· Anxiety or stress. °· Inflammation around your heart (pericarditis) or lung (pleuritis or pleurisy). °· A blood clot in your lung. °· A collapsed lung (pneumothorax). It can develop suddenly on its own (spontaneous pneumothorax) or from trauma to the chest. °· Shingles infection (varicella-zoster virus). °· Heart attack. °· Damage to the bones, muscles, and cartilage that make up your chest wall. This can include: °¨ Bruised bones due  to injury. °¨ Strained muscles or cartilage due to frequent or repeated coughing or overwork. °¨ Fracture to one or more ribs. °¨ Sore cartilage due to inflammation (costochondritis). °RISK FACTORS  °Risk factors for chest pain may include: °· Activities that increase your risk for trauma or injury to your chest. °· Respiratory infections or conditions that cause frequent coughing. °· Medical conditions or overeating that can cause heartburn. °· Heart disease or family history of heart disease. °· Conditions or health behaviors that increase your risk of developing a blood clot. °· Having had chicken pox (varicella zoster). °SIGNS AND SYMPTOMS °Chest pain can feel like: °· Burning or tingling on the surface of your chest or deep in your chest. °· Crushing, pressure, aching, or squeezing pain. °· Dull or sharp pain that is worse when you move, cough, or take a deep breath. °· Pain that is also felt in your back, neck, shoulder, or arm, or pain that spreads to any of these areas. °Your chest pain may come and go, or it may stay constant. °DIAGNOSIS °Lab tests or other studies may be needed to find the cause of your pain. Your health care provider may have you take a test called an ambulatory ECG (electrocardiogram). An ECG records your heartbeat patterns at the time the test is performed. You may also have other tests, such as: °· Transthoracic echocardiogram (TTE). During echocardiography, sound waves are used to create a picture of all of the heart structures and to   look at how blood flows through your heart. °· Transesophageal echocardiogram (TEE). This is a more advanced imaging test that obtains images from inside your body. It allows your health care provider to see your heart in finer detail. °· Cardiac monitoring. This allows your health care provider to monitor your heart rate and rhythm in real time. °· Holter monitor. This is a portable device that records your heartbeat and can help to diagnose abnormal  heartbeats. It allows your health care provider to track your heart activity for several days, if needed. °· Stress tests. These can be done through exercise or by taking medicine that makes your heart beat more quickly. °· Blood tests. °· Imaging tests. °TREATMENT  °Your treatment depends on what is causing your chest pain. Treatment may include: °· Medicines. These may include: °¨ Acid blockers for heartburn. °¨ Anti-inflammatory medicine. °¨ Pain medicine for inflammatory conditions. °¨ Antibiotic medicine, if an infection is present. °¨ Medicines to dissolve blood clots. °¨ Medicines to treat coronary artery disease. °· Supportive care for conditions that do not require medicines. This may include: °¨ Resting. °¨ Applying heat or cold packs to injured areas. °¨ Limiting activities until pain decreases. °HOME CARE INSTRUCTIONS °· If you were prescribed an antibiotic medicine, finish it all even if you start to feel better. °· Avoid any activities that bring on chest pain. °· Do not use any tobacco products, including cigarettes, chewing tobacco, or electronic cigarettes. If you need help quitting, ask your health care provider. °· Do not drink alcohol. °· Take medicines only as directed by your health care provider. °· Keep all follow-up visits as directed by your health care provider. This is important. This includes any further testing if your chest pain does not go away. °· If heartburn is the cause for your chest pain, you may be told to keep your head raised (elevated) while sleeping. This reduces the chance that acid will go from your stomach into your esophagus. °· Make lifestyle changes as directed by your health care provider. These may include: °¨ Getting regular exercise. Ask your health care provider to suggest some activities that are safe for you. °¨ Eating a heart-healthy diet. A registered dietitian can help you to learn healthy eating options. °¨ Maintaining a healthy weight. °¨ Managing  diabetes, if necessary. °¨ Reducing stress. °SEEK MEDICAL CARE IF: °· Your chest pain does not go away after treatment. °· You have a rash with blisters on your chest. °· You have a fever. °SEEK IMMEDIATE MEDICAL CARE IF:  °· Your chest pain is worse. °· You have an increasing cough, or you cough up blood. °· You have severe abdominal pain. °· You have severe weakness. °· You faint. °· You have chills. °· You have sudden, unexplained chest discomfort. °· You have sudden, unexplained discomfort in your arms, back, neck, or jaw. °· You have shortness of breath at any time. °· You suddenly start to sweat, or your skin gets clammy. °· You feel nauseous or you vomit. °· You suddenly feel light-headed or dizzy. °· Your heart begins to beat quickly, or it feels like it is skipping beats. °These symptoms may represent a serious problem that is an emergency. Do not wait to see if the symptoms will go away. Get medical help right away. Call your local emergency services (911 in the [place] Do not drive yourself to the hospital. °  °This information is not intended to replace advice given to you by your health care provider.   Make sure you discuss any questions you have with your health care provider. °  °Document Released: 10/20/2004 Document Revised: 01/31/2014 Document Reviewed: 08/16/2013 °Elsevier Interactive Patient Education ©2016 [employer] ° °

## 2015-07-02 NOTE — ED Notes (Signed)
PA at bedside discussing test results and dispo plan of care. 

## 2016-01-16 IMAGING — MG MM DIAG BREAST TOMO UNI LEFT
6 series · 6 of 18 positions shown · non-contrast
Comparison: Previous exam(s).

CLINICAL DATA: Screening recall for possible left breast mass.

EXAM:
DIGITAL DIAGNOSTIC LEFT MAMMOGRAM WITH 3D TOMOSYNTHESIS AND CAD

[L ML]
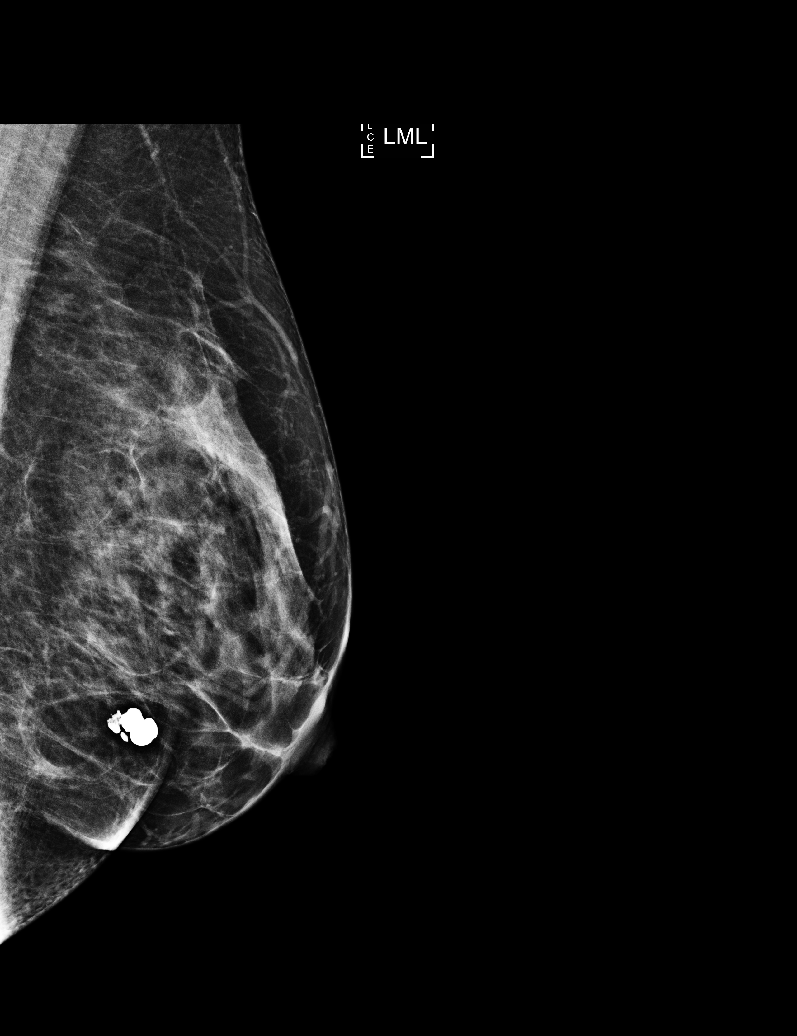

[L CC]
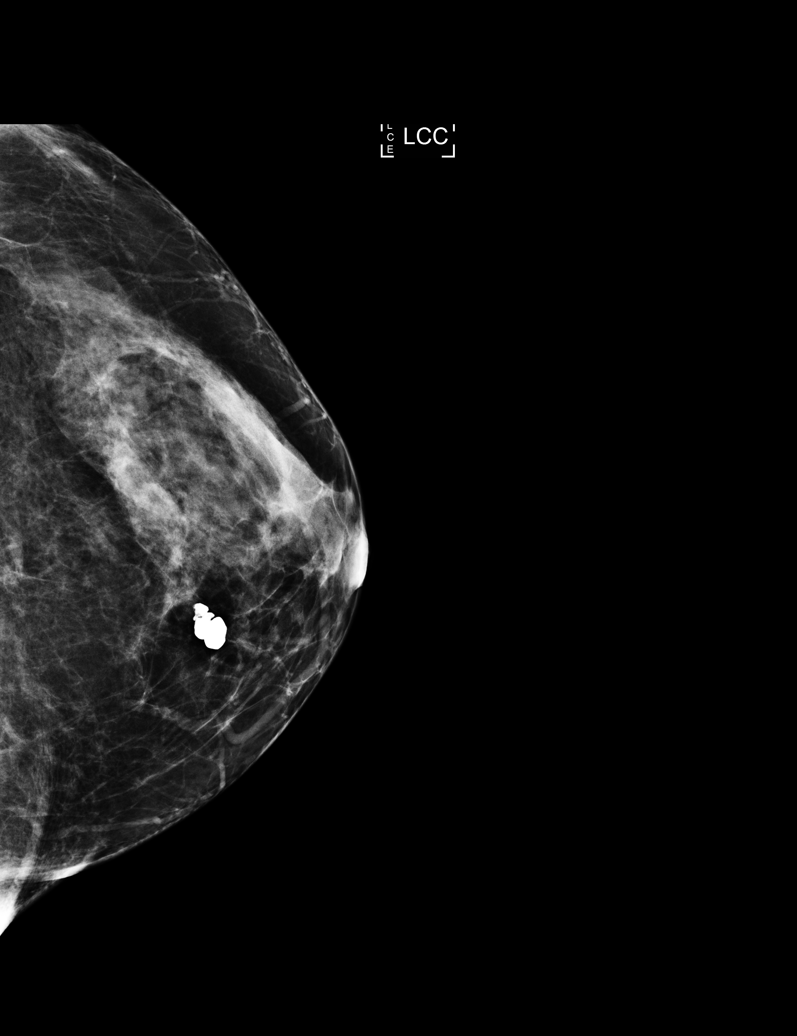

[L MLO]
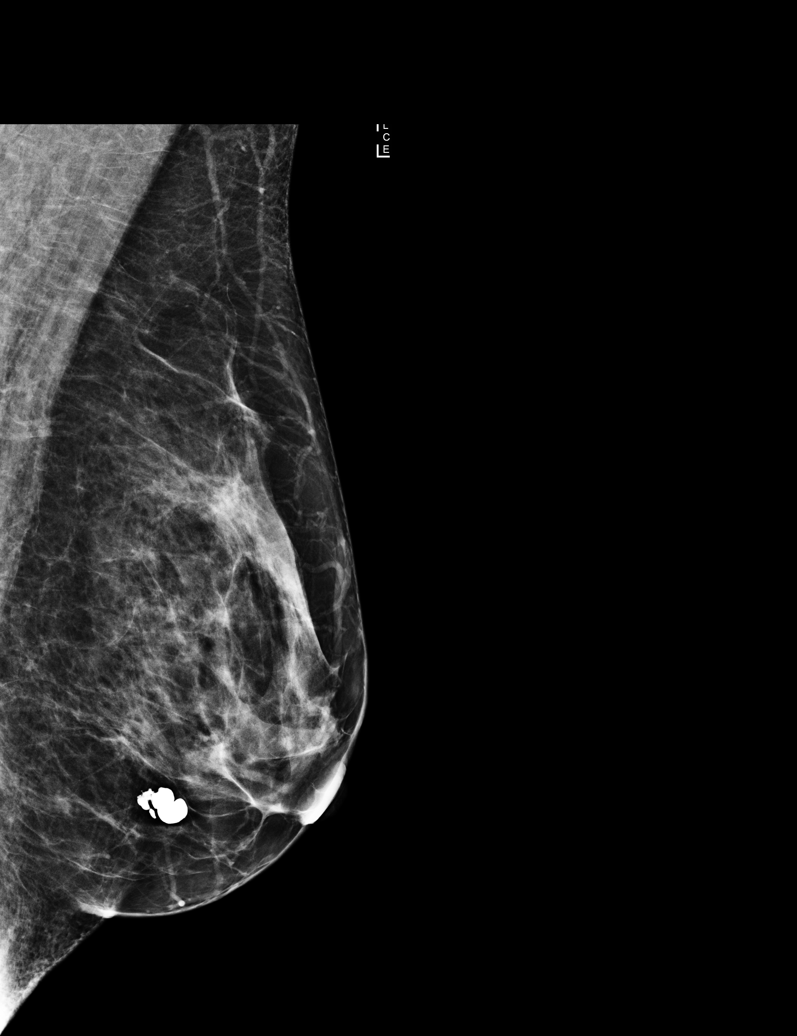

[L CC tomo · tomo slice 33/64.0]
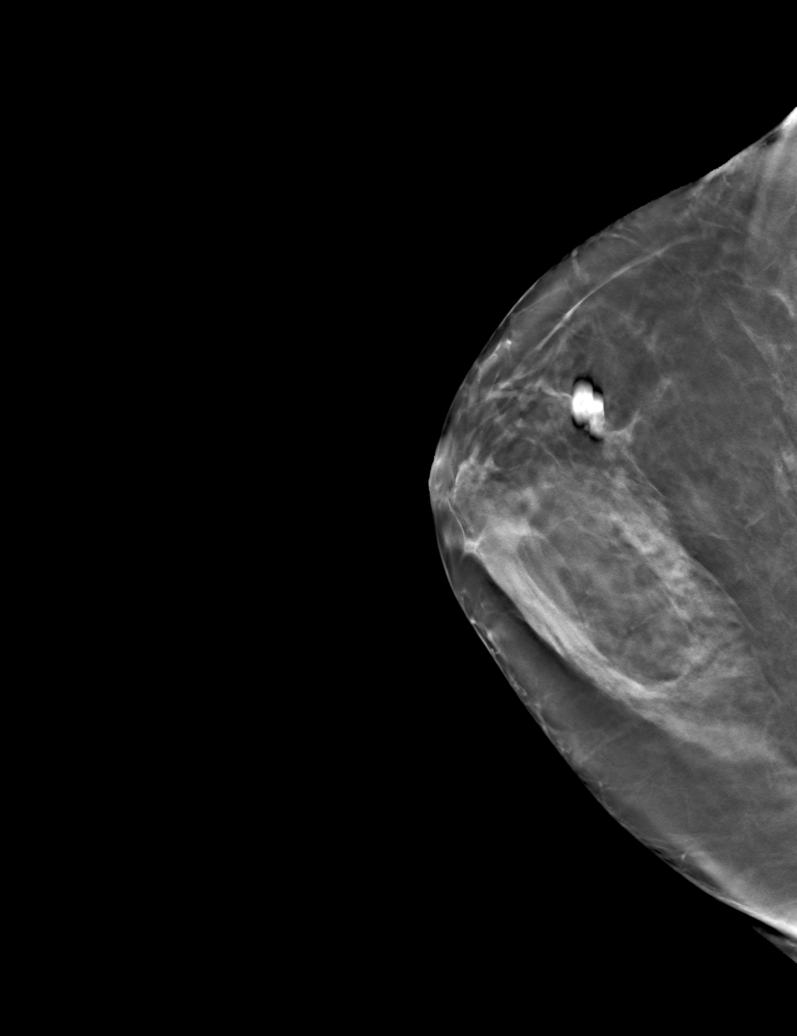

[L MLO tomo · tomo slice 31/62.0]
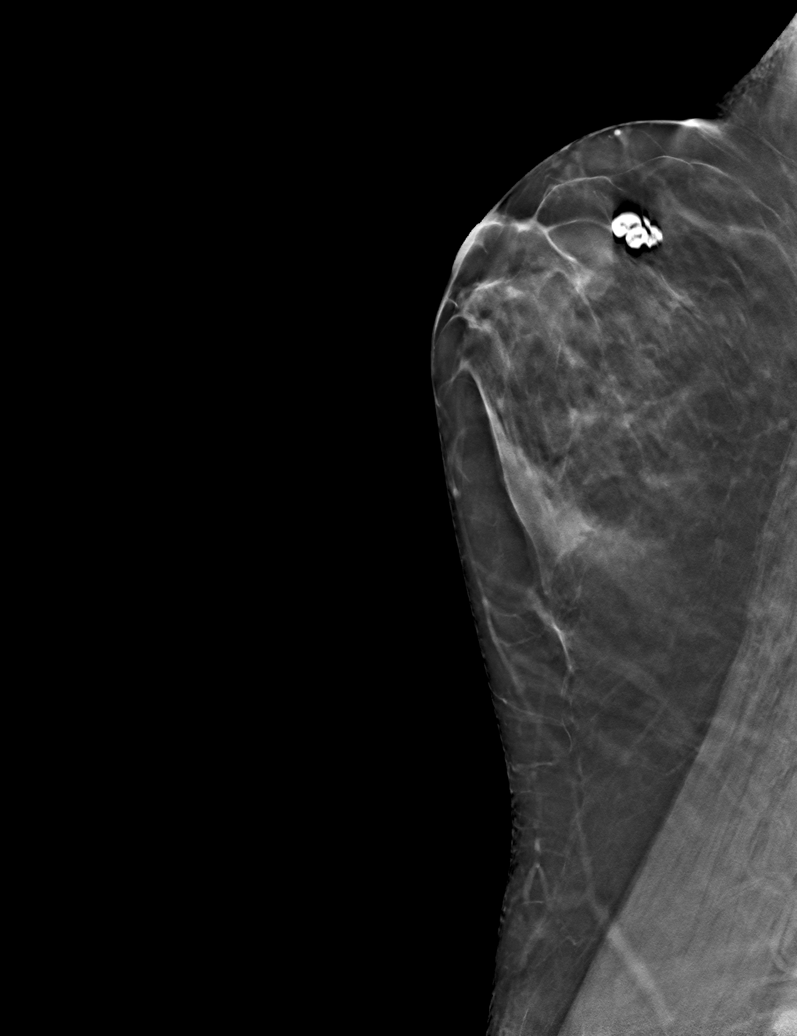

[L ML tomo · tomo slice 31/62.0]
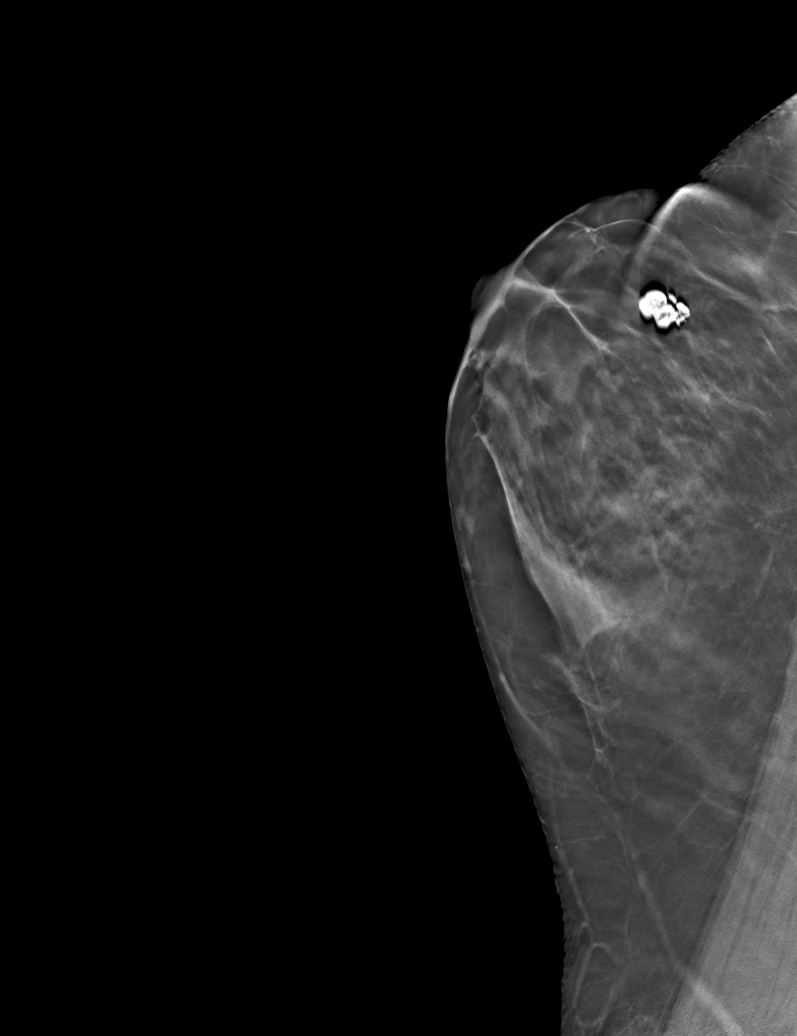

[6 of 18 positions shown; findings below may reference images not displayed]

ACR Breast Density Category c: The breast tissue is heterogeneously
dense, which may obscure small masses.
FINDINGS: Cc and MLO tomosynthesis as well as true lateral tomosynthesis was
performed of the left breast. The initially questioned left breast
asymmetry/ possible mass resolves on the additional imaging with
findings compatible with overlapping tissue. There is no
mammographic evidence of malignancy in the left breast.

Mammographic images were processed with CAD.
IMPRESSION: Initially questioned left breast asymmetry resolves on the
additional imaging compatible with overlapping tissue. There is no
mammographic evidence of malignancy in the left breast.

RECOMMENDATION:
Screening mammogram in one year.(Code:[id number]-[id number])

I have discussed the findings and recommendations with the patient.
Results were also provided in writing at the conclusion of the
visit. If applicable, a reminder letter will be sent to the patient
regarding the next appointment.

BI-RADS CATEGORY  1: Negative.

## 2016-06-10 IMAGING — CR DG CHEST 1V
1 series · 1 of 1 positions shown · non-contrast
Comparison: Film from earlier in the same day

CLINICAL DATA: Chest pain, followup examination to assess for
nipple shadow

EXAM:
CHEST 1 VIEW

[w chest pa]
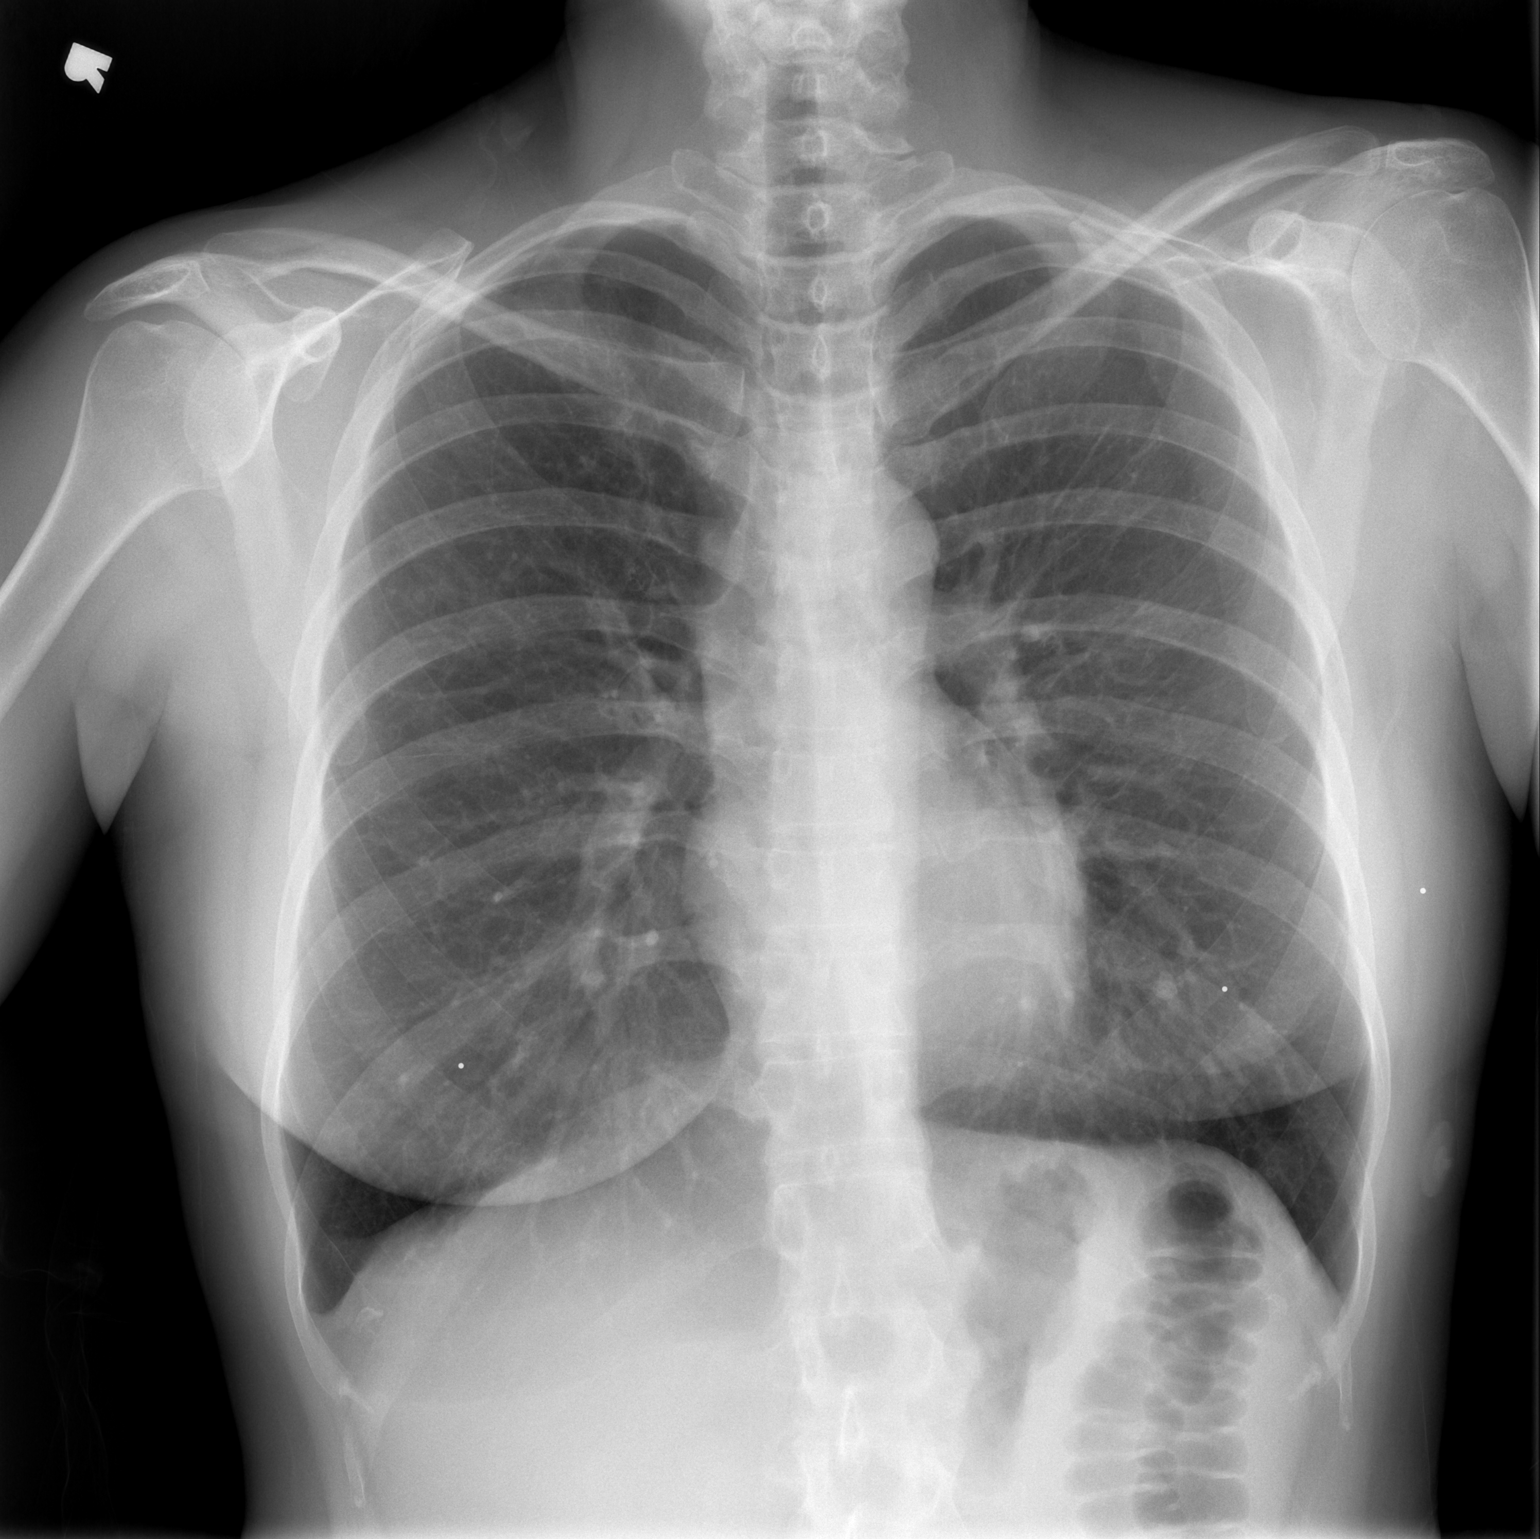

[1 of 1 positions shown; findings below may reference images not displayed]

FINDINGS: Repeat film with nipple markers in place shows a previously seen
calcification to lie medial to the nipple shadow on the left. This
is consistent with a calcified granuloma. No focal infiltrate is
noted. No bony abnormality is seen.
IMPRESSION: Calcified granuloma in the left base. No other focal abnormality is
noted.
